# Patient Record
Sex: Female | Born: 1950 | Race: White | Hispanic: No | Marital: Married | Smoking: Never smoker
Health system: Southern US, Community
[De-identification: ages and names within clinical notes are randomized; demographics above are authoritative.]

## PROBLEM LIST (undated history)

## (undated) DIAGNOSIS — K219 Gastro-esophageal reflux disease without esophagitis: Secondary | ICD-10-CM

## (undated) DIAGNOSIS — M1990 Unspecified osteoarthritis, unspecified site: Secondary | ICD-10-CM

## (undated) DIAGNOSIS — G894 Chronic pain syndrome: Secondary | ICD-10-CM

## (undated) DIAGNOSIS — I10 Essential (primary) hypertension: Secondary | ICD-10-CM

## (undated) DIAGNOSIS — G43909 Migraine, unspecified, not intractable, without status migrainosus: Secondary | ICD-10-CM

## (undated) HISTORY — PX: OTHER SURGICAL HISTORY: SHX169

## (undated) HISTORY — PX: ANKLE CLOSED REDUCTION: SHX880

## (undated) HISTORY — PX: ABDOMINAL HYSTERECTOMY: SHX81

---

## 2006-10-22 ENCOUNTER — Emergency Department (HOSPITAL_COMMUNITY): Admission: EM | Admit: 2006-10-22 | Discharge: 2006-10-22 | Payer: Self-pay | Admitting: Emergency Medicine

## 2006-11-08 ENCOUNTER — Inpatient Hospital Stay (HOSPITAL_COMMUNITY): Admission: RE | Admit: 2006-11-08 | Discharge: 2006-11-09 | Payer: Self-pay | Admitting: Neurosurgery

## 2007-05-16 ENCOUNTER — Ambulatory Visit: Payer: Self-pay | Admitting: Gastroenterology

## 2010-02-01 ENCOUNTER — Encounter: Payer: Self-pay | Admitting: Neurosurgery

## 2010-05-26 NOTE — Op Note (Signed)
Yvonne Taylor, Yvonne Taylor               ACCOUNT NO.:  [id number]   MEDICAL RECORD NO.:  [id number]          PATIENT TYPE:  INP   LOCATION:  3029                         FACILITY:  [facility]   PHYSICIAN:  Clydene Fake, M.D.  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   PROCEDURE:  Intracervical decompression, diskectomy and fusion at C5-6  with LifeNet allograft bone Trestle anterior cervical plate.   SURGEON:  Clydene Fake, M.D.   General endotracheal tube anesthesia.   ASSISTANT:  None.   ESTIMATED BLOOD LOSS:  Minimal.   BLOOD GIVEN:  None.   DRAINS:  None.   COMPLICATIONS:  None.   REASON FOR PROCEDURE:  The patient is a 60 year old woman who has had  arm tingling bilaterally with neck pain.  MRI was done showing some  multiple spondylitic changes in the neck with large central disk  herniation at 5-6, compression of the spinal with some cord change.  The  patient with some myelopathic findings on exam including some  dysesthetic sensation in the hands.  The patient is brought in for  decompression of the spinal cord.   PROCEDURE IN DETAIL:  The patient was brought to the operating room,  general anesthesia was induced.  The patient was placed in 10 pounds  Holter traction, prepped and draped in a sterile fashion.  The site of  incision was injected with 10 mL of 1% lidocaine with epinephrine.  An  incision was then made from the midline to the anterior border of the  sternocleidomastoid muscle in the left side of the neck, incision take  down to the platysma and hemostasis obtained was obtained with Bovie  cauterization.  The platysma was opened with the Bovie and blunt  dissection taken  through the anterior cervical fascia of the anterior  cervical spine.  Two needles were placed in the disk spaces.  X-rays  obtained showing the bottom needle at 5-6 level, top needle at 4-5.  Needles were removed as the 5-6 disk was incised with a 15 blade and  partial diskectomy performed.  The longus colli muscles were reflected  laterally with the Bovie and then a self-retaining retractor was placed.  Diskectomy was continued with pituitary rongeurs and curets.  Anterior  osteophytes removed with Kerrison punches and distraction pins placed  into C5 and C6 and the interspace distracted.  Diskectomy was then  continued and a microscope was brought in for microdissection, 1 and 2  mm Kerrison punches were used to remove posterior disk ligament and  osteomatous free fragments of disk were found.  These were removed  decompressing the central canal.  We found more free fragments going  caudally going behind the body of 6 and these were removed with the  nerve hook and bilateral foraminotomies were performed.  When we were  finished, we had  good decompression of the central canal and nerve  roots.  A high speed drill was used to remove cartilaginous end plate.  We measured the height of the disk space to be 5 mm.  A 5-mm LifeNet  allograft bone was tapped into place, countersunk a couple millimeters.  We check posterior to the graft with the nerve hook and there was plenty  of room between the bone graft and dura.  The wound was irrigated with  antibiotics solution and distraction pins were removed.  Hemostasis was  obtained with Gelfoam and thrombin.  Weight was removed from the  traction.  We still had good position of the bone plug and a Trestle  anterior cervical plate was placed over the anterior cervical spine.  Two screws were placed in the C5, two in the C6.  These were tightened  down.  Landmarks were obtained and we could see the screws at C5 and  vaguely the screws going to C6.   The body habitus, it was hard to read  the bottom part of plate but through intraoperatively, very good.  We  irrigated with antibiotic solution.  We had good hemostasis and the  retractors were removed.  Fascia closed with 0 Vicryl interrupted  sutures.  Subcutaneous tissue closed with the same.  Skin closed with  benzoin and Steri-Strips.  Dressings were placed.  The patient was  placed back in the supine position, awoken from anesthesia and  transferred to the recovery room in a stable condition.           ______________________________  Clydene Fake, M.D.     JRH/MEDQ  D:  11/08/2006  T:  11/08/2006  Job:  [id number]

## 2010-07-08 ENCOUNTER — Other Ambulatory Visit (HOSPITAL_COMMUNITY): Payer: Self-pay | Admitting: Family Medicine

## 2010-07-08 DIAGNOSIS — R599 Enlarged lymph nodes, unspecified: Secondary | ICD-10-CM

## 2010-07-13 ENCOUNTER — Other Ambulatory Visit (HOSPITAL_COMMUNITY): Payer: Self-pay

## 2010-10-21 LAB — URINALYSIS, ROUTINE W REFLEX MICROSCOPIC
Bilirubin Urine: NEGATIVE
Glucose, UA: NEGATIVE
Ketones, ur: NEGATIVE
Leukocytes, UA: NEGATIVE
Nitrite: NEGATIVE
Urobilinogen, UA: 1

## 2010-10-21 LAB — CBC
Hemoglobin: 14
RBC: 4.51
RDW: 13.8
WBC: 15.2 — ABNORMAL HIGH

## 2010-10-21 LAB — URINE MICROSCOPIC-ADD ON

## 2010-10-21 LAB — BASIC METABOLIC PANEL
CO2: 30
Calcium: 10
Creatinine, Ser: 1.02
GFR calc Af Amer: >60
GFR calc non Af Amer: 56 — ABNORMAL LOW

## 2010-10-21 LAB — APTT: aPTT: 22 — ABNORMAL LOW

## 2010-10-21 LAB — PROTIME-INR: INR: 0.9

## 2011-08-17 ENCOUNTER — Ambulatory Visit: Payer: Self-pay | Admitting: Family Medicine

## 2011-12-01 ENCOUNTER — Ambulatory Visit: Payer: Self-pay | Admitting: Family Medicine

## 2014-11-05 ENCOUNTER — Emergency Department
Admission: EM | Admit: 2014-11-05 | Discharge: 2014-11-05 | Disposition: A | Discharge status: Home / Self Care | Payer: Medicare Other | Attending: Emergency Medicine | Admitting: Emergency Medicine

## 2014-11-05 ENCOUNTER — Encounter: Payer: Self-pay | Admitting: Emergency Medicine

## 2014-11-05 DIAGNOSIS — R0602 Shortness of breath: Secondary | ICD-10-CM | POA: Insufficient documentation

## 2014-11-05 DIAGNOSIS — I10 Essential (primary) hypertension: Secondary | ICD-10-CM | POA: Diagnosis not present

## 2014-11-05 DIAGNOSIS — R110 Nausea: Secondary | ICD-10-CM | POA: Insufficient documentation

## 2014-11-05 DIAGNOSIS — R42 Dizziness and giddiness: Secondary | ICD-10-CM | POA: Insufficient documentation

## 2014-11-05 HISTORY — DX: Gastro-esophageal reflux disease without esophagitis: K21.9

## 2014-11-05 HISTORY — DX: Chronic pain syndrome: G89.4

## 2014-11-05 HISTORY — DX: Essential (primary) hypertension: I10

## 2014-11-05 HISTORY — DX: Unspecified osteoarthritis, unspecified site: M19.90

## 2014-11-05 HISTORY — DX: Migraine, unspecified, not intractable, without status migrainosus: G43.909

## 2014-11-05 LAB — URINALYSIS COMPLETE WITH MICROSCOPIC (ARMC ONLY)
Bacteria, UA: NONE SEEN
Bilirubin Urine: NEGATIVE
Glucose, UA: NEGATIVE mg/dL
KETONES UR: NEGATIVE mg/dL
LEUKOCYTES UA: NEGATIVE
Nitrite: NEGATIVE
PH: 6 (ref 5.0–8.0)
PROTEIN: NEGATIVE mg/dL
SPECIFIC GRAVITY, URINE: 1.006 (ref 1.005–1.030)

## 2014-11-05 LAB — CBC
HCT: 39.1 % (ref 35.0–47.0)
HEMOGLOBIN: 13 g/dL (ref 12.0–16.0)
MCH: 30.3 pg (ref 26.0–34.0)
MCHC: 33.3 g/dL (ref 32.0–36.0)
MCV: 90.8 fL (ref 80.0–100.0)
PLATELETS: 376 10*3/uL (ref 150–440)
RBC: 4.31 MIL/uL (ref 3.80–5.20)
RDW: 13.5 % (ref 11.5–14.5)
WBC: 6.6 10*3/uL (ref 3.6–11.0)

## 2014-11-05 LAB — BASIC METABOLIC PANEL
ANION GAP: 9 (ref 5–15)
BUN: 15 mg/dL (ref 6–20)
CHLORIDE: 104 mmol/L (ref 101–111)
CO2: 25 mmol/L (ref 22–32)
CREATININE: 0.96 mg/dL (ref 0.44–1.00)
Calcium: 9 mg/dL (ref 8.9–10.3)
GFR calc non Af Amer: >60 mL/min (ref >60)
Glucose, Bld: 109 mg/dL — ABNORMAL HIGH (ref 65–99)
POTASSIUM: 4.4 mmol/L (ref 3.5–5.1)
SODIUM: 138 mmol/L (ref 135–145)

## 2014-11-05 MED ORDER — MECLIZINE HCL 25 MG PO TABS
50.0000 mg | ORAL_TABLET | Freq: Once | ORAL | Status: AC
Start: 2014-11-05 — End: 2014-11-05
  Administered 2014-11-05: 50 mg via ORAL
  Filled 2014-11-05: qty 2

## 2014-11-05 MED ORDER — MECLIZINE HCL 25 MG PO TABS
25.0000 mg | ORAL_TABLET | Freq: Three times a day (TID) | ORAL | Status: DC | PRN
Start: 2014-11-05 — End: 2017-04-06

## 2014-11-05 MED ORDER — DIAZEPAM 5 MG PO TABS
5.0000 mg | ORAL_TABLET | Freq: Once | ORAL | Status: AC
  Administered 2014-11-05: 5 mg via ORAL
  Filled 2014-11-05: qty 1

## 2014-11-05 MED ORDER — DIAZEPAM 5 MG PO TABS
5.0000 mg | ORAL_TABLET | Freq: Three times a day (TID) | ORAL | Status: DC | PRN
Start: 2014-11-05 — End: 2017-04-06

## 2014-11-05 NOTE — ED Provider Notes (Addendum)
[facility] Emergency Department Provider Note     Time seen: ----------------------------------------- 10:35 AM on 11/05/2014 -----------------------------------------    I have reviewed the triage vital signs and the nursing notes.   HISTORY  Chief Complaint Dizziness    HPI Yvonne Taylor is a 64 y.o. female who presents ER for earache 2 weeks ago. Patient states by week and half ago she had increased dizziness worse when sheturns her head. Patient feels like the room is spinning and feels nauseated. She's had this happen before but never this severe. She denies fevers, chills, chest pain. She does have shortness of breath and she is wondering if maybe this is anxiety.   Past Medical History  Diagnosis Date  . Hypertension   . GERD (gastroesophageal reflux disease)   . Migraines   . Arthritis   . Chronic pain syndrome     There are no active problems to display for this patient.   Past Surgical History  Procedure Laterality Date  . Abdominal hysterectomy      Allergies Amitriptyline; Augmentin; Celebrex; Ciprofloxacin; Clonidine derivatives; Cymbalta; Elavil; Hydralazine; Hydrocortisone; Lipitor; Lyrica; Micardis; Mobic; Neurontin; Norvasc; Paxil; Prozac; and Vasotec  Social History Social History  Substance Use Topics  . Smoking status: Never Smoker   . Smokeless tobacco: None  . Alcohol Use: No    Review of Systems Constitutional: Negative for fever. Eyes: Negative for visual changes. ENT: Negative for sore throat. Cardiovascular: Negative for chest pain. Respiratory: Positive for shortness of breath Gastrointestinal: Negative for abdominal pain, vomiting and diarrhea. Genitourinary: Negative for dysuria. Musculoskeletal: Negative for back pain. Skin: Negative for rash. Neurological: Negative for headaches, positive for dizziness  10-point ROS otherwise negative.  ____________________________________________   PHYSICAL  EXAM:  VITAL SIGNS: ED Triage Vitals  Enc Vitals Group     BP 11/05/14 0936 194/90 mmHg     Pulse Rate 11/05/14 0936 70     Resp 11/05/14 0936 20     Temp 11/05/14 0936 97.5 F (36.4 C)     Temp Source 11/05/14 0936 Oral     SpO2 11/05/14 0936 99 %     Weight 11/05/14 0936 260 lb (117.935 kg)     Height 11/05/14 0936 5\' 8"  (1.727 m)     Head Cir --      Peak Flow --      Pain Score 11/05/14 0937 0     Pain Loc --      Pain Edu? --      Excl. in GC? --     Constitutional: Alert and oriented. Well appearing and in no distress. Eyes: Conjunctivae are normal. PERRL. Normal extraocular movements. ENT   Head: Normocephalic and atraumatic.   Nose: No congestion/rhinnorhea.   Mouth/Throat: Mucous membranes are moist.   Neck: No stridor. Cardiovascular: Normal rate, regular rhythm. Normal and symmetric distal pulses are present in all extremities. No murmurs, rubs, or gallops. Respiratory: Normal respiratory effort without tachypnea nor retractions. Breath sounds are clear and equal bilaterally. No wheezes/rales/rhonchi. Gastrointestinal: Soft and nontender. No distention. No abdominal bruits.  Musculoskeletal: Nontender with normal range of motion in all extremities. No joint effusions.  No lower extremity tenderness nor edema. Neurologic:  Normal speech and language. No gross focal neurologic deficits are appreciated. Speech is normal. No gait instability. Skin:  Skin is warm, dry and intact. No rash noted. Psychiatric: Mood and affect are normal. Speech and behavior are normal. Patient exhibits appropriate insight and judgment. ____________________________________________  EKG: Interpreted by  me. Normal sinus rhythm normal axis normal intervals. No evidence of hypertrophy or acute infarction. Rate is 60 bpm  ____________________________________________  ED COURSE:  Pertinent labs & imaging results that were available during my care of the patient were reviewed by me  and considered in my medical decision making (see chart for details). Patient describes peripheral vertigo. She received meclizine and Valium and we will check basic labs. ____________________________________________    LABS (pertinent positives/negatives)  Labs Reviewed  BASIC METABOLIC PANEL - Abnormal; Notable for the following:    Glucose, Bld 109 (*)    All other components within normal limits  URINALYSIS COMPLETEWITH MICROSCOPIC ([facility] ONLY) - Abnormal; Notable for the following:    Color, Urine STRAW (*)    APPearance CLEAR (*)    Hgb urine dipstick 2+ (*)    Squamous Epithelial / LPF 0-5 (*)    All other components within normal limits  CBC   ____________________________________________  FINAL ASSESSMENT AND PLAN  Vertigo  Plan: Patient with labs as dictated above. Patient is in no acute distress, feeling better after meclizine and Valium. She'll be discharged with the same medications and referred to ENT for follow-up   Emily Filbert, MD   Emily Filbert, MD 11/05/14 1037  Emily Filbert, MD 11/05/14 1155

## 2014-11-05 NOTE — ED Notes (Signed)
Pt to ed with c/o earache x 2 weeks ago.  Pt states about 1 1/2 weeks ago increased dizziness, worse with laying down.  Pt also reports sob associated with the dizziness.  Pt states when she lays flat and turns over in the bed, the room feels like it is spinning and she feels nauseated.

## 2014-11-05 NOTE — Discharge Instructions (Signed)

## 2016-08-16 DIAGNOSIS — M19019 Primary osteoarthritis, unspecified shoulder: Secondary | ICD-10-CM | POA: Insufficient documentation

## 2017-03-15 ENCOUNTER — Ambulatory Visit: Payer: Self-pay | Admitting: Urology

## 2017-03-17 ENCOUNTER — Encounter: Payer: Self-pay | Admitting: Urology

## 2017-03-17 ENCOUNTER — Ambulatory Visit: Payer: Medicare Other | Admitting: Urology

## 2017-03-17 VITALS — BP 176/98 | HR 76 | Ht 67.0 in | Wt 263.0 lb

## 2017-03-17 DIAGNOSIS — N3941 Urge incontinence: Secondary | ICD-10-CM

## 2017-03-17 DIAGNOSIS — N3281 Overactive bladder: Secondary | ICD-10-CM

## 2017-03-17 LAB — BLADDER SCAN AMB NON-IMAGING: SCAN RESULT: 41

## 2017-03-17 NOTE — Progress Notes (Signed)
03/17/2017 4:37 PM   Oneal Deputy 07/09/1950 [id number]  Referring provider: Leanna Sato, MD [address] [place], [place] [address]  Chief Complaint  Patient presents with  . New Patient (Initial Visit)    HPI: Patient is a 67 -year-old Caucasian female who is referred to [place] by Dr. Darreld Mclean for urinary incontinence with her daughter, Thayer Ohm.    Patient states that she has had urinary incontinence since 2016.  She started to wear panty liners last year.  She is now up to regular pads.    Patient has incontinence with urge, but she denies any leakage with laughing, coughing, etc.  She states her worst time is in the morning.  She wakes to void at 4 am, 6 am and 7 am.  She is also having nocturia x 2-3.    She is having associated urinary frequency, strong urgency and intermittency.  Patient denies any gross hematuria, dysuria or suprapubic/flank pain.  Patient denies any fevers, chills, nausea or vomiting.  Her PVR is 41 mL.    She does not have a history of urinary tract infections, STI's or injury to the bladder.   She does not have a history of nephrolithiasis, GU surgery or GU trauma.     She is not sexually active.  She is post menopausal.   She denies constipation and/or diarrhea.   She is not having pain with bladder filling.    She has not had any recent imaging studies.    She is not drinking water daily.   She states she tried to drink 20 to 30 ounces of water, but it just "went right through her."  She drinks 4 to 6 Dr. Osvaldo Angst daily.  She does not drink coffee or tea.  She has a glass of orange juice in the morning.  She does not drink alcohol.   She had been on Vesicare 5 mg daily, but she quit the medication 3 months ago and noticed no difference.  She tried AZO OTC and it did not help.       PMH: Past Medical History:  Diagnosis Date  . Arthritis   . Chronic pain syndrome   . GERD (gastroesophageal reflux disease)   . Hypertension   .  Migraines     Surgical History: Past Surgical History:  Procedure Laterality Date  . ABDOMINAL HYSTERECTOMY    . ANKLE CLOSED REDUCTION    . carpel tunnel    . neck fusion    . spinal allograft      Home Medications:  Allergies as of 03/17/2017      Reactions   Amitriptyline Swelling   Augmentin [amoxicillin-pot Clavulanate] Swelling   Celebrex [celecoxib] Swelling   Ciprofloxacin Swelling   Clonidine Derivatives Swelling   Cymbalta [duloxetine Hcl] Swelling   Elavil [amitriptyline Hcl] Swelling   Hydralazine Swelling   Hydrocortisone Swelling   Lipitor [atorvastatin] Swelling   Lyrica [pregabalin] Swelling   Micardis [telmisartan] Swelling   Mobic [meloxicam] Swelling   Neurontin [gabapentin] Swelling   Norvasc [amlodipine Besylate] Swelling   Paxil [paroxetine] Swelling   Prozac [fluoxetine Hcl] Swelling   Vasotec [enalapril Maleate] Swelling      Medication List        Accurate as of 03/17/17  4:37 PM. Always use your most recent med list.          diazepam 5 MG tablet Commonly known as:  VALIUM Take 1 tablet (5 mg total) by mouth every 8 (  eight) hours as needed (to be taken with meclizine).   etodolac 500 MG tablet Commonly known as:  LODINE Take 500 mg by mouth 2 (two) times daily.   hydrOXYzine 25 MG tablet Commonly known as:  ATARAX/VISTARIL Take 25 mg by mouth 3 (three) times daily as needed.   meclizine 25 MG tablet Commonly known as:  ANTIVERT Take 1 tablet (25 mg total) by mouth 3 (three) times daily as needed for dizziness or nausea.   omeprazole 20 MG capsule Commonly known as:  PRILOSEC Take 20 mg by mouth daily.   solifenacin 5 MG tablet Commonly known as:  VESICARE Take 5 mg by mouth daily.       Allergies:  Allergies  Allergen Reactions  . Amitriptyline Swelling  . Augmentin [Amoxicillin-Pot Clavulanate] Swelling  . Celebrex [Celecoxib] Swelling  . Ciprofloxacin Swelling  . Clonidine Derivatives Swelling  . Cymbalta  [Duloxetine Hcl] Swelling  . Elavil [Amitriptyline Hcl] Swelling  . Hydralazine Swelling  . Hydrocortisone Swelling  . Lipitor [Atorvastatin] Swelling  . Lyrica [Pregabalin] Swelling  . Micardis [Telmisartan] Swelling  . Mobic [Meloxicam] Swelling  . Neurontin [Gabapentin] Swelling  . Norvasc [Amlodipine Besylate] Swelling  . Paxil [Paroxetine] Swelling  . Prozac [Fluoxetine Hcl] Swelling  . Vasotec [Enalapril Maleate] Swelling    Family History: Family History  Problem Relation Age of Onset  . Hematuria Mother   . Kidney cancer Neg Hx   . Kidney disease Neg Hx   . Prostate cancer Neg Hx     Social History:  reports that  has never smoked. she has never used smokeless tobacco. She reports that she does not drink alcohol or use drugs.  ROS: UROLOGY Frequent Urination?: Yes Hard to postpone urination?: Yes Burning/pain with urination?: No Get up at night to urinate?: Yes Leakage of urine?: Yes Urine stream starts and stops?: Yes Trouble starting stream?: No Do you have to strain to urinate?: No Blood in urine?: No Urinary tract infection?: No Sexually transmitted disease?: No Injury to kidneys or bladder?: No Painful intercourse?: No Weak stream?: No Currently pregnant?: No Vaginal bleeding?: No Last menstrual period?: n  Gastrointestinal Nausea?: No Vomiting?: No Indigestion/heartburn?: No Diarrhea?: No Constipation?: No  Constitutional Fever: No Night sweats?: No Weight loss?: No Fatigue?: No  Skin Skin rash/lesions?: No Itching?: No  Eyes Blurred vision?: No Double vision?: No  Ears/Nose/Throat Sore throat?: No Sinus problems?: Yes  Hematologic/Lymphatic Swollen glands?: No Easy bruising?: Yes  Cardiovascular Leg swelling?: Yes Chest pain?: No  Respiratory Cough?: No Shortness of breath?: Yes  Endocrine Excessive thirst?: No  Musculoskeletal Back pain?: Yes Joint pain?: Yes  Neurological Headaches?: No Dizziness?:  No  Psychologic Depression?: Yes Anxiety?: No  Physical Exam: BP (!) 176/98   Pulse 76   Ht 5\' 7"  (1.702 m)   Wt 263 lb (119.3 kg)   BMI 41.19 kg/m   Constitutional: Well nourished. Alert and oriented, No acute distress. HEENT:  AT, moist mucus membranes. Trachea midline, no masses. Cardiovascular: No clubbing, cyanosis, or edema. Respiratory: Normal respiratory effort, no increased work of breathing. GI: Abdomen is soft, non tender, non distended, no abdominal masses. Liver and spleen not palpable.  No hernias appreciated.  Stool sample for occult testing is not indicated.   GU: No CVA tenderness.  No bladder fullness or masses.  Atrophic external genitalia, normal pubic hair distribution, no lesions.  Normal urethral meatus, no lesions, no prolapse, no discharge.   No urethral masses, tenderness and/or tenderness. No bladder fullness, tenderness  or masses. Pale vagina mucosa, poor estrogen effect, no discharge, no lesions, good pelvic support, grade II cystocele noted.  Rectocele noted.  Cervix, uterus and adnexa are surgically absent.  Anus and perineum are without rashes or lesions.    Skin: No rashes, bruises or suspicious lesions. Lymph: No cervical or inguinal adenopathy. Neurologic: Grossly intact, no focal deficits, moving all 4 extremities. Psychiatric: Normal mood and affect.  Laboratory Data: Lab Results  Component Value Date   WBC 6.6 11/05/2014   HGB 13.0 11/05/2014   HCT 39.1 11/05/2014   MCV 90.8 11/05/2014   PLT 376 11/05/2014    Lab Results  Component Value Date   CREATININE 0.96 11/05/2014    No results found for: PSA  No results found for: TESTOSTERONE  No results found for: HGBA1C  No results found for: TSH  No results found for: CHOL, HDL, CHOLHDL, VLDL, LDLCALC  No results found for: AST No results found for: ALT No components found for: ALKALINEPHOPHATASE No components found for: BILIRUBINTOTAL  No results found for:  ESTRADIOL  Urinalysis    Component Value Date/Time   COLORURINE STRAW (A) 11/05/2014 0953   APPEARANCEUR CLEAR (A) 11/05/2014 0953   LABSPEC 1.006 11/05/2014 0953   PHURINE 6.0 11/05/2014 0953   GLUCOSEU NEGATIVE 11/05/2014 0953   HGBUR 2+ (A) 11/05/2014 0953   BILIRUBINUR NEGATIVE 11/05/2014 0953   KETONESUR NEGATIVE 11/05/2014 0953   PROTEINUR NEGATIVE 11/05/2014 0953   UROBILINOGEN 1.0 11/04/2006 1119   NITRITE NEGATIVE 11/05/2014 0953   LEUKOCYTESUR NEGATIVE 11/05/2014 0953   I have reviewed the labs.   Pertinent Imaging: Results for Oneal DeputyBRIGGS, Almendra F (MRN [id number]) as of 04/04/2017 13:08  Ref. Range 03/17/2017 16:13  Scan Result Unknown 41    Assessment & Plan:    1. Urge Incontinence  - offered behavioral therapies, bladder training, bladder control strategies and pelvic floor muscle training  - fluid management - encouraged patient to avoid sodas and other sugary drinks  - patient with uncontrolled HTN so she is not a candidate for beta-3 adrenergic receptor agonist   - would like to try anticholinergic therapy.  Given Toviaz 8mg  samples, # 28.   Advised of the side effects, such as: Dry eyes, dry mouth, constipation, mental confusion and/or urinary retention.   - RTC in 3 weeks for PVR and symptom recheck   - BLADDER SCAN AMB NON-IMAGING   Return in about 3 weeks (around 04/07/2017) for PVR and OAB questionnaire.  These notes generated with voice recognition software. I apologize for typographical errors.  Michiel CowboySHANNON Mckenzye Cutright, PA-C  [facility] [address], [address] [place], [place] [address] [phone]  I spent 45 minutes with this patient of which greater than 50% was spent in counseling & coordination of care with the patient.

## 2017-04-04 ENCOUNTER — Ambulatory Visit: Payer: Medicare Other | Admitting: Urology

## 2017-04-05 NOTE — Progress Notes (Signed)
04/06/2017 2:39 PM   Yvonne Taylor 09-24-1950 [id number]  Referring provider: Leanna Sato, MD [address] [place], [place] [address]  No chief complaint on file.   HPI: 67 yo WF with urge incontinence who presents today for a three week follow up after a trial of Toviaz.  Background history Patient is a 77 -year-old Caucasian female who is referred to [place] by Dr. Darreld Mclean for urinary incontinence with her daughter, Thayer Ohm.  Patient states that she has had urinary incontinence since 2016.  She started to wear panty liners last year.  She is now up to regular pads.  Patient has incontinence with urge, but she denies any leakage with laughing, coughing, etc.  She states her worst time is in the morning.  She wakes to void at 4 am, 6 am and 7 am.  She is also having nocturia x 2-3.  She is having associated urinary frequency, strong urgency and intermittency.  Patient denies any gross hematuria, dysuria or suprapubic/flank pain.  Patient denies any fevers, chills, nausea or vomiting.  Her PVR is 41 mL.  She does not have a history of urinary tract infections, STI's or injury to the bladder.  She does not have a history of nephrolithiasis, GU surgery or GU trauma.   She is not sexually active.  She is post menopausal.   She denies constipation and/or diarrhea.   She is not having pain with bladder filling.   She has not had any recent imaging studies.  She is not drinking water daily.   She states she tried to drink 20 to 30 ounces of water, but it just "went right through her."  She drinks 4 to 6 Dr. Osvaldo Angst daily.  She does not drink coffee or tea.  She has a glass of orange juice in the morning.  She does not drink alcohol.   She had been on Vesicare 5 mg daily, but she quit the medication 3 months ago and noticed no difference.  She tried AZO OTC and it did not help.      She was started on Toviaz 8 mg daily.  The patient has been experiencing urgency x 4-7, frequency x 4-7,  is restricting fluids to avoid visits to the restroom, is engaging in toilet mapping, incontinence x 0-3 and nocturia x 4-7.  Her PVR is 45 mL.  Patient denies any gross hematuria, dysuria or suprapubic/flank pain.  Patient denies any fevers, chills, nausea or vomiting.   She is down to a panty liner daily.  She is having a dry mouth.     PMH: Past Medical History:  Diagnosis Date  . Arthritis   . Chronic pain syndrome   . GERD (gastroesophageal reflux disease)   . Hypertension   . Migraines     Surgical History: Past Surgical History:  Procedure Laterality Date  . ABDOMINAL HYSTERECTOMY    . ANKLE CLOSED REDUCTION    . carpel tunnel    . neck fusion    . spinal allograft      Home Medications:  Allergies as of 04/06/2017      Reactions   Amitriptyline Swelling   Augmentin [amoxicillin-pot Clavulanate] Swelling   Celebrex [celecoxib] Swelling   Ciprofloxacin Swelling   Clonidine Derivatives Swelling   Cymbalta [duloxetine Hcl] Swelling   Elavil [amitriptyline Hcl] Swelling   Hydralazine Swelling   Hydrocortisone Swelling   Lipitor [atorvastatin] Swelling   Lyrica [pregabalin] Swelling   Micardis [telmisartan] Swelling  Mobic [meloxicam] Swelling   Neurontin [gabapentin] Swelling   Norvasc [amlodipine Besylate] Swelling   Paxil [paroxetine] Swelling   Prozac [fluoxetine Hcl] Swelling   Vasotec [enalapril Maleate] Swelling      Medication List        Accurate as of 04/06/17  2:39 PM. Always use your most recent med list.          etodolac 500 MG tablet Commonly known as:  LODINE Take 500 mg by mouth 2 (two) times daily.   fesoterodine 8 MG Tb24 tablet Commonly known as:  TOVIAZ Take 1 tablet (8 mg total) by mouth daily.   hydrOXYzine 25 MG tablet Commonly known as:  ATARAX/VISTARIL Take 25 mg by mouth 3 (three) times daily as needed.   omeprazole 20 MG capsule Commonly known as:  PRILOSEC Take 20 mg by mouth daily.   solifenacin 5 MG tablet Commonly  known as:  VESICARE Take 5 mg by mouth daily.   valsartan 320 MG tablet Commonly known as:  DIOVAN Take 320 mg by mouth daily.       Allergies:  Allergies  Allergen Reactions  . Amitriptyline Swelling  . Augmentin [Amoxicillin-Pot Clavulanate] Swelling  . Celebrex [Celecoxib] Swelling  . Ciprofloxacin Swelling  . Clonidine Derivatives Swelling  . Cymbalta [Duloxetine Hcl] Swelling  . Elavil [Amitriptyline Hcl] Swelling  . Hydralazine Swelling  . Hydrocortisone Swelling  . Lipitor [Atorvastatin] Swelling  . Lyrica [Pregabalin] Swelling  . Micardis [Telmisartan] Swelling  . Mobic [Meloxicam] Swelling  . Neurontin [Gabapentin] Swelling  . Norvasc [Amlodipine Besylate] Swelling  . Paxil [Paroxetine] Swelling  . Prozac [Fluoxetine Hcl] Swelling  . Vasotec [Enalapril Maleate] Swelling    Family History: Family History  Problem Relation Age of Onset  . Hematuria Mother   . Kidney cancer Neg Hx   . Kidney disease Neg Hx   . Prostate cancer Neg Hx     Social History:  reports that she has never smoked. She has never used smokeless tobacco. She reports that she does not drink alcohol or use drugs.  ROS: UROLOGY Frequent Urination?: No Hard to postpone urination?: No Burning/pain with urination?: No Get up at night to urinate?: Yes Leakage of urine?: No Urine stream starts and stops?: No Trouble starting stream?: No Do you have to strain to urinate?: No Blood in urine?: No Urinary tract infection?: No Sexually transmitted disease?: No Injury to kidneys or bladder?: No Painful intercourse?: No Weak stream?: No Currently pregnant?: No Vaginal bleeding?: No Last menstrual period?: n  Gastrointestinal Nausea?: No Vomiting?: No Indigestion/heartburn?: No Diarrhea?: No Constipation?: No  Constitutional Fever: No Night sweats?: No Weight loss?: No Fatigue?: No  Skin Skin rash/lesions?: No Itching?: No  Eyes Blurred vision?: No Double vision?:  No  Ears/Nose/Throat Sore throat?: No Sinus problems?: Yes  Hematologic/Lymphatic Swollen glands?: No Easy bruising?: No  Cardiovascular Leg swelling?: No Chest pain?: No  Respiratory Cough?: Yes Shortness of breath?: Yes  Endocrine Excessive thirst?: No  Musculoskeletal Back pain?: Yes Joint pain?: No  Neurological Headaches?: No Dizziness?: No  Psychologic Depression?: No Anxiety?: No  Physical Exam: BP (!) 181/95   Pulse 73   Ht 5\' 7"  (1.702 m)   Wt 269 lb 6.4 oz (122.2 kg)   BMI 42.19 kg/m   Constitutional: Well nourished. Alert and oriented, No acute distress. HEENT: [place] AT, moist mucus membranes. Trachea midline, no masses. Cardiovascular: No clubbing, cyanosis, or edema. Respiratory: Normal respiratory effort, no increased work of breathing. Skin: No rashes, bruises or  suspicious lesions. Lymph: No cervical or inguinal adenopathy. Neurologic: Grossly intact, no focal deficits, moving all 4 extremities. Psychiatric: Normal mood and affect.   Laboratory Data: Lab Results  Component Value Date   WBC 6.6 11/05/2014   HGB 13.0 11/05/2014   HCT 39.1 11/05/2014   MCV 90.8 11/05/2014   PLT 376 11/05/2014    Lab Results  Component Value Date   CREATININE 0.96 11/05/2014    No results found for: PSA  No results found for: TESTOSTERONE  No results found for: HGBA1C  No results found for: TSH  No results found for: CHOL, HDL, CHOLHDL, VLDL, LDLCALC  No results found for: AST No results found for: ALT No components found for: ALKALINEPHOPHATASE No components found for: BILIRUBINTOTAL  No results found for: ESTRADIOL  Urinalysis    Component Value Date/Time   COLORURINE STRAW (A) 11/05/2014 0953   APPEARANCEUR CLEAR (A) 11/05/2014 0953   LABSPEC 1.006 11/05/2014 0953   PHURINE 6.0 11/05/2014 0953   GLUCOSEU NEGATIVE 11/05/2014 0953   HGBUR 2+ (A) 11/05/2014 0953   BILIRUBINUR NEGATIVE 11/05/2014 0953   KETONESUR NEGATIVE 11/05/2014  0953   PROTEINUR NEGATIVE 11/05/2014 0953   UROBILINOGEN 1.0 11/04/2006 1119   NITRITE NEGATIVE 11/05/2014 0953   LEUKOCYTESUR NEGATIVE 11/05/2014 0953   I have reviewed the labs.   Pertinent Imaging: Results for Yvonne DeputyBRIGGS, Yvonne Taylor (MRN [id number]) as of 04/06/2017 14:33  Ref. Range 04/06/2017 14:31  Scan Result Unknown 45     Assessment & Plan:    1. Urge Incontinence  - offered behavioral therapies, bladder training, bladder control strategies and pelvic floor muscle training -patient currently is having difficulty with severe leg cramps and deferred at this time  - fluid management - encouraged patient to avoid sodas and other sugary drinks  - patient with uncontrolled HTN so she is not a candidate for beta-3 adrenergic receptor agonist   - Toviaz 8mg  was effective and she would like to continue the medication - script sent to pharmacy  - RTC in one year for PVR and symptom recheck   - BLADDER SCAN AMB NON-IMAGING   Return in about 1 year (around 04/07/2018) for PVR and OAB questionnaire.  These notes generated with voice recognition software. I apologize for typographical errors.  Michiel CowboySHANNON Jovaun Levene, PA-C  [facility] [address], [address] [place], [place] [address] [phone]  I spent 45 minutes with this patient of which greater than 50% was spent in counseling & coordination of care with the patient.

## 2017-04-06 ENCOUNTER — Telehealth: Payer: Self-pay | Admitting: Urology

## 2017-04-06 ENCOUNTER — Ambulatory Visit: Payer: Medicare Other | Admitting: Urology

## 2017-04-06 ENCOUNTER — Encounter: Payer: Self-pay | Admitting: Urology

## 2017-04-06 VITALS — BP 181/95 | HR 73 | Ht 67.0 in | Wt 269.4 lb

## 2017-04-06 DIAGNOSIS — N3941 Urge incontinence: Secondary | ICD-10-CM | POA: Diagnosis not present

## 2017-04-06 LAB — BLADDER SCAN AMB NON-IMAGING: SCAN RESULT: 45

## 2017-04-06 MED ORDER — FESOTERODINE FUMARATE ER 8 MG PO TB24: 8.0000 mg | ORAL_TABLET | Freq: Every day | ORAL | 11 refills | Status: DC

## 2017-04-06 NOTE — Telephone Encounter (Signed)
Patient's insurance will most likely deny her Toviaz at this time, but we need to appeal their decision as she has been on there formulary medication and they were not effective.

## 2017-05-11 ENCOUNTER — Telehealth: Payer: Self-pay | Admitting: Urology

## 2017-05-11 NOTE — Telephone Encounter (Signed)
Per documentation by Michiel Cowboy, PA on 04/06/17, this medication needs to be appealed with her insurance. Pt has already tried alternatives, she should remain on Toviaz. I have instructed pt that I will work on this PA, in the meantime she can come and pick up samples, pt states understanding and will come by Friday. Samples left up front.

## 2017-05-11 NOTE — Telephone Encounter (Signed)
Pt called office LMOM asking for another alternative to her "bladder pill" pt states that her insurance will no longer pay for what she's been taking. Pt did not specify the name of her "bladder pill" nor pharmacy. Please advise @ [phone]. Thanks.

## 2017-05-17 NOTE — Telephone Encounter (Addendum)
Submitted prior authorization for Toviaz , via covermymeds.

## 2017-05-18 MED ORDER — OXYBUTYNIN CHLORIDE ER 10 MG PO TB24: 10.0000 mg | ORAL_TABLET | Freq: Every day | ORAL | 3 refills | Status: DC

## 2017-05-18 NOTE — Addendum Note (Signed)
Addended by: Vickki Hearing on: 05/18/2017 10:33 AM   Modules accepted: Orders

## 2017-05-18 NOTE — Telephone Encounter (Signed)
Pt currently has samples of Toviaz, I will try and appeal based off the pt being a poor candidate for Myrbetriq due to her BP. Worst they can say is no, at which point we can proceed with Oxybutynin. Would you be okay with this?

## 2017-05-18 NOTE — Telephone Encounter (Signed)
She is not a good candidate for Myrbetriq as her BP is uncontrolled.  She has only been on Vesicare in the past.  We will have to have her try oxybutynin ER 10 mg daily.

## 2017-05-18 NOTE — Telephone Encounter (Signed)
Insurance rep states that it is recommended to proceed with Oxybutynin, he states pt needs to try this alternative even though Myrbetriq is contraindicated due to BP. Will send in oxybutynin and call pt.

## 2017-05-18 NOTE — Telephone Encounter (Signed)
Per pt insurance, she must try and fail Myrbetriq or Oxbutynin ER.   I do not see these listed in her chart as being tried, are we aware of hx with these?

## 2017-06-20 ENCOUNTER — Telehealth: Payer: Self-pay | Admitting: Urology

## 2017-06-20 NOTE — Telephone Encounter (Signed)
Spoke with patient and notified her she should contact her insurance company and find out what medications are on her formulary list. With that information we can then send in another medication for her to try. Patient verbalized understanding

## 2017-06-20 NOTE — Telephone Encounter (Signed)
Pt called to let us now she cannot take Oxybutnin.  Her mouth, tongue and throat are so dry, and she's losing her tastebuds.  She would like to know what her options are.

## 2017-11-04 DIAGNOSIS — M7062 Trochanteric bursitis, left hip: Secondary | ICD-10-CM | POA: Insufficient documentation

## 2018-01-31 DIAGNOSIS — I10 Essential (primary) hypertension: Secondary | ICD-10-CM | POA: Insufficient documentation

## 2018-01-31 DIAGNOSIS — M7989 Other specified soft tissue disorders: Secondary | ICD-10-CM | POA: Insufficient documentation

## 2018-01-31 DIAGNOSIS — I890 Lymphedema, not elsewhere classified: Secondary | ICD-10-CM | POA: Insufficient documentation

## 2018-04-03 ENCOUNTER — Ambulatory Visit: Payer: Medicare Other | Admitting: Urology

## 2018-07-06 ENCOUNTER — Encounter: Payer: Self-pay | Admitting: Neurology

## 2018-07-06 NOTE — Progress Notes (Signed)
New Patient Virtual Visit via Video Note The purpose of this virtual visit is to provide medical care while limiting exposure to the novel coronavirus.    Consent was obtained for video visit:  Yes.   Answered questions that patient had about telehealth interaction:  Yes.   I discussed the limitations, risks, security and privacy concerns of performing an evaluation and management service by telemedicine. I also discussed with the patient that there may be a patient responsible charge related to this service. The patient expressed understanding and agreed to proceed.  Pt location: Home Physician Location: office Name of referring provider:  Leanna Taylor, Yvonne M, MD I connected with Yvonne DeputyAmanda F Taylor at patients initiation/request on 07/07/2018 at  1:50 PM EDT by video enabled telemedicine application and verified that I am speaking with the correct person using two identifiers. Pt MRN:  [id number] Pt DOB:  1950-04-07 Video Participants:  Yvonne Taylor    History of Present Illness: Yvonne Deputymanda F Riviere is a 68 y.o. right-handed female with GERD, hypertension, and chronic pain syndrome presenting for evaluation of burning sensation of the lower legs.   Around early spring 2020, she began having burning pain over the anterior shins in both legs.  Pain does not affect the posterior leg or into the feet.  Pain is there with rest or activity and often wakes her up from sleeping.  There is no associated weakness.  She does complain of leg cramps which has improved after taking Soma.  She is currently taking Vicodin for severe pain and has mild relief.  She has previously tried Lyrica, Neurontin, Elavil, and Cymbalta for pain.    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  Upon further questioning, patient does endorse having vibration sensation of the feet the past few years, however her current symptoms of burning pain in the lower leg is new.  She has been  disability in 2011 for pain.    Past Medical History:  Diagnosis Date  . Arthritis   . Chronic pain syndrome   . GERD (gastroesophageal reflux disease)   . Hypertension   . Migraines     Past Surgical History:  Procedure Laterality Date  . ABDOMINAL HYSTERECTOMY    . ANKLE CLOSED REDUCTION    . carpel tunnel    . neck fusion    . spinal allograft       Medications:  Outpatient Encounter Medications as of 07/07/2018  Medication Sig  . carisoprodol (SOMA) 350 MG tablet Take 350 mg by mouth 3 (three) times daily.  Marland Kitchen. HYDROcodone-acetaminophen (NORCO/VICODIN) 5-325 MG tablet Take 1 tablet by mouth every 6 (six) hours as needed for moderate pain.  . hydrOXYzine (ATARAX/VISTARIL) 25 MG tablet Take 25 mg by mouth 3 (three) times daily as needed.  Marland Kitchen. omeprazole (PRILOSEC) 20 MG capsule Take 20 mg by mouth daily.  . solifenacin (VESICARE) 5 MG tablet Take 5 mg by mouth daily.  . valsartan (DIOVAN) 320 MG tablet Take 320 mg by mouth daily.  . vitamin B-12 (CYANOCOBALAMIN) 500 MCG tablet Take 500 mcg by mouth daily.  . [DISCONTINUED] oxybutynin (DITROPAN-XL) 10 MG 24 hr tablet Take 1 tablet (10 mg total) by mouth daily.  . [DISCONTINUED] etodolac (LODINE) 500 MG tablet Take 500 mg by mouth 2 (two) times daily.  . [DISCONTINUED] fesoterodine (TOVIAZ) 8 MG TB24 tablet Take 1 tablet (8 mg total) by mouth daily.   No facility-administered encounter medications on file as of 07/07/2018.  Allergies:  Allergies  Allergen Reactions  . Amitriptyline Swelling  . Augmentin [Amoxicillin-Pot Clavulanate] Swelling  . Celebrex [Celecoxib] Swelling  . Ciprofloxacin Swelling  . Clonidine Derivatives Swelling  . Cymbalta [Duloxetine Hcl] Swelling  . Elavil [Amitriptyline Hcl] Swelling  . Hydralazine Swelling  . Hydrocortisone Swelling  . Lipitor [Atorvastatin] Swelling  . Lyrica [Pregabalin] Swelling  . Micardis [Telmisartan] Swelling  . Mobic [Meloxicam] Swelling  . Neurontin [Gabapentin]  Swelling  . Norvasc [Amlodipine Besylate] Swelling  . Paxil [Paroxetine] Swelling  . Prozac [Fluoxetine Hcl] Swelling  . Vasotec [Enalapril Maleate] Swelling    Family History: Family History  Problem Relation Age of Onset  . Hematuria Mother   . Kidney cancer Neg Hx   . Kidney disease Neg Hx   . Prostate cancer Neg Hx     Social History: Social History   Tobacco Use  . Smoking status: Never Smoker  . Smokeless tobacco: Never Used  Substance Use Topics  . Alcohol use: No  . Drug use: No   Social History   Social History Narrative   Lives alone      No steps in home      Right handed      Highest level of edu- 10th grade       Review of Systems:  CONSTITUTIONAL: No fevers, chills, night sweats, or weight loss.   EYES: No visual changes or eye pain ENT: No hearing changes.  No history of nose bleeds.   RESPIRATORY: No cough, wheezing and shortness of breath.   CARDIOVASCULAR: Negative for chest pain, and palpitations.   GI: Negative for abdominal discomfort, blood in stools or black stools.  No recent change in bowel habits.   GU:  No history of incontinence.   MUSCLOSKELETAL: +history of joint pain or swelling.  +myalgias.   SKIN: Negative for lesions, rash, and itching.   HEMATOLOGY/ONCOLOGY: Negative for prolonged bleeding, bruising easily, and swollen nodes.  No history of cancer.   ENDOCRINE: Negative for cold or heat intolerance, polydipsia or goiter.   PSYCH:  No depression or anxiety symptoms.   NEURO: As Above.   Vital Signs:  Ht 5\' 7"  (1.702 Taylor)   Wt 270 lb (122.5 kg)   BMI 42.29 kg/Taylor    General Medical Exam:  Well appearing, comfortable.  Nonlabored breathing.  No deformity or edema.  No rash.  Neurological Exam: MENTAL STATUS including orientation to time, place, person, recent and remote memory, attention span and concentration, language, and fund of knowledge is normal.  Speech is not dysarthric.  CRANIAL NERVES:  Normal conjugate,  extra-ocular eye movements in all directions of gaze.  No ptosis.  Normal facial symmetry and movements.  Normal shoulder shrug and head rotation.  Tongue is midline.  MOTOR:  Antigravity in all extremities.  No abnormal movements.  No pronator drift.   SENSORY & REFLEXES:  Unable to assess  COORDINATION/GAIT: Normal finger to nose bilaterally.  Intact rapid alternating movements bilaterally.  Unable to rise from a chair without using arms.  Gait mildly wide based, small steps, stable and unassisted.   IMPRESSION: Bilateral lower leg burning pain, lumbosacral radiculopathy vs. Neuropathy.  Unlikely neuropathy as her symptoms only involve the anterior shins and neuropathy tends to be circumferential.  She could have L4-5 radiculopathy which could manifest with burning pain in this location Recommend starting physical therapy She has tried gabapentin, Lyrica, amitriptyline, and Cymbalta with no benefit for pain. For refractory pain, she is appropriate to see pain management -  patient informed my office does not manage chronic pain I will see her back in the office, if there is no improvement, next step is MRI lumbar spine   Follow Up Instructions:  I discussed the assessment and treatment plan with the patient. The patient was provided an opportunity to ask questions and all were answered. The patient agreed with the plan and demonstrated an understanding of the instructions.   The patient was advised to call back or seek an in-person evaluation if the symptoms worsen or if the condition fails to improve as anticipated.  Return to clinic in 6-8 weeks  Total Time spent:  45 min   [place], DO

## 2018-07-07 ENCOUNTER — Telehealth (INDEPENDENT_AMBULATORY_CARE_PROVIDER_SITE_OTHER): Payer: Medicare Other | Admitting: Neurology

## 2018-07-07 ENCOUNTER — Other Ambulatory Visit: Payer: Self-pay

## 2018-07-07 VITALS — Ht 67.0 in | Wt 270.0 lb

## 2018-07-07 DIAGNOSIS — M79605 Pain in left leg: Secondary | ICD-10-CM

## 2018-07-07 DIAGNOSIS — M5417 Radiculopathy, lumbosacral region: Secondary | ICD-10-CM

## 2018-07-07 DIAGNOSIS — M79604 Pain in right leg: Secondary | ICD-10-CM

## 2018-07-25 ENCOUNTER — Ambulatory Visit: Payer: Medicare Other | Attending: Neurology

## 2018-07-25 ENCOUNTER — Other Ambulatory Visit: Payer: Self-pay

## 2018-07-25 DIAGNOSIS — M545 Low back pain: Secondary | ICD-10-CM | POA: Diagnosis present

## 2018-07-25 DIAGNOSIS — M79605 Pain in left leg: Secondary | ICD-10-CM | POA: Diagnosis present

## 2018-07-25 DIAGNOSIS — G8929 Other chronic pain: Secondary | ICD-10-CM | POA: Insufficient documentation

## 2018-07-25 DIAGNOSIS — M79604 Pain in right leg: Secondary | ICD-10-CM | POA: Insufficient documentation

## 2018-07-25 DIAGNOSIS — M62830 Muscle spasm of back: Secondary | ICD-10-CM | POA: Diagnosis present

## 2018-07-25 NOTE — Therapy (Signed)
[place] [facility] [address] [place], [place], [address] Phone: [phone]   Fax:  [phone]  Physical Therapy Evaluation  Patient Details  Name: Yvonne Taylor MRN: [id number] Date of Birth: 1950-04-17 Referring Provider (PT): Nita SicklePatel, Donika K   Encounter Date: 07/25/2018  PT End of Session - 07/25/18 1611    Visit Number  1    Number of Visits  13    Date for PT Re-Evaluation  09/05/18    PT Start Time  1430    PT Stop Time  1530    PT Time Calculation (min)  60 min    Activity Tolerance  Patient tolerated treatment well;No increased pain;Patient limited by fatigue;Patient limited by pain    Behavior During Therapy  [facility] tasks assessed/performed       Past Medical History:  Diagnosis Date   Arthritis    Chronic pain syndrome    GERD (gastroesophageal reflux disease)    Hypertension    Migraines     Past Surgical History:  Procedure Laterality Date   ABDOMINAL HYSTERECTOMY     ANKLE CLOSED REDUCTION     carpel tunnel     neck fusion     spinal allograft      There were no vitals filed for this visit.   Subjective Assessment - 07/25/18 1558    Subjective  Pt is a 68 y/o right-handed F who presents PT with a medical dx of bilateral leg pain. Pts chief complaint is currently bilateral, anterior shooting pain in her LE (L=R). Pt describes her pain from her bilateral hips to her knees as a dull pain, then her knees to ankles pain as a shooting, burning pain. Her pain initially presented in 2016 s/p a fall. Pts hip pain is relatively new, with onset in the past year. Pts PMH includes HTN and peripheral neuropathy. Pt reports a worse and current 10/10 pain, with relieving factors which include pain medications (temporary relief), cold packs, topical spray, lying on her back, and sitting. Pts aggravating factors include ambulation, standing, and sitting on firmer surfaces, where she reports having  numbing occurred x2 bilaterally. Pt currently sleeps in her recliner and gets 2-3 hours of sleep daily. Pt denies unremitting night pain, loss of sacral sensation, and b/b dysfunction. Pt currently lives alone with 2 STE with bilateral handrails. Pt reports that her daughter comes over daily to help with ADLs and can support her PRN. Pt does not currently use AD to ambulate. Pt has current PT goals of decreasing her bilateral leg pain and swelling.    Pertinent History  HTN, peripheral neuropathy    Limitations  Lifting;Standing;House hold activities;Walking    How long can you sit comfortably?  Indefinitely in recliner    How long can you walk comfortably?  30-45 minutes    Diagnostic tests  NCV    Patient Stated Goals  Decrease pain and knee swelling (L>R)    Currently in Pain?  Yes    Pain Score  10-Worst pain ever    Pain Location  Leg    Pain Orientation  Left;Right;Anterior;Lower    Pain Descriptors / Indicators  Burning;Dull    Pain Type  Chronic pain;Neuropathic pain    Pain Radiating Towards  Radiates anteriorly to ankles    Pain Onset  More than a month ago    Pain Frequency  Constant    Aggravating Factors   Walking, standing, lying prone  Pain Relieving Factors  Topical spray, cold packs, sitting, lying on back, pain medications    Effect of Pain on Daily Activities  Decreases her physical activity and ADLs         [facility] PT Assessment - 07/25/18 1546      Assessment   Medical Diagnosis  Bilateral leg pain    Referring Provider (PT)  Narda Amber K    Onset Date/Surgical Date  01/11/14    Hand Dominance  Right      Precautions   Precautions  None      Restrictions   Weight Bearing Restrictions  No      Balance Screen   Has the patient fallen in the past 6 months  No    Has the patient had a decrease in activity level because of a fear of falling?   Yes    Is the patient reluctant to leave their home because of a fear of falling?   No      Home Environment    Living Environment  Private residence    Living Arrangements  Alone    Available Help at Discharge  Family;Neighbor;Friend(s)    Type of Home  House    Home Access  Level entry    Home Layout  One level;Able to live on main level with bedroom/bathroom;Full bath on main level    [place] - 2 wheels;Walker - standard;Grab bars - toilet;Grab bars - tub/shower;Wheelchair - manual    Additional Comments  Does not use AD      Prior Function   Level of Independence  Independent with basic ADLs    Leisure  Loves karaoke, dancing      Cognition   Overall Cognitive Status  Within Functional Limits for tasks assessed      Observation/Other Assessments   Other Surveys   Activities of Balance and Confidence Scale;Modified Oswestry    Activities of Balance Confidence Scale (ABC Scale)   45.6    Modified Oswestry  50      Observation/Other Assessments-Edema    Edema  --   left knee effusion - did not assess     Functional Tests   Functional tests  Sit to Stand      Sit to Stand   Comments  Requires increased time, increased reliance of bilateral UE      Posture/Postural Control   Posture/Postural Control  Postural limitations    Postural Limitations  Rounded Shoulders;Forward head;Flexed trunk      ROM / Strength   AROM / PROM / Strength  Strength      Strength   Overall Strength  Deficits    Strength Assessment Site  Hip;Knee;Ankle    Right/Left Hip  Right;Left    Right Hip Flexion  3+/5    Left Hip Flexion  4/5    Right/Left Knee  Right;Left    Right Knee Extension  4/5    Left Knee Extension  4+/5    Right/Left Ankle  Right;Left    Right Ankle Dorsiflexion  5/5    Left Ankle Dorsiflexion  5/5      Flexibility   Soft Tissue Assessment /Muscle Length  no      Palpation   Palpation comment  TTP, muscular guarding lumbar and bilateral hip regions      Bed Mobility   Bed Mobility  --   Requires bilateral UE use and min-mod assist     Transfers   Transfers   Sit  to Stand    Comments  Requires increased time and UE support      Ambulation/Gait   Ambulation/Gait  Yes    Gait Pattern  Step-to pattern;Decreased stride length;Wide base of support;Trunk flexed;Poor foot clearance - left;Poor foot clearance - right    Ambulation Surface  Level    Gait Comments  Decreased gait velocity, stride length, clearance/heel strike bilaterally       Objective measurements completed on examination: See above findings.     Manual Therapy to address and improve her pain response and muscular spasms/tightness.  Central PA joint mobilizations L2-S2, Grades I-II Soft tissue mobilizations (compression, distraction) lumbar and gluteal/hip regions. Decreased pain response with increased exposure.  Supine bridging 2x8  At the end of the session, pt reported an improvement in pain, citing a current 7/10, and self-reporting that her walking improved.         PT Education - 07/25/18 1609    Education Details  Pt educated on technique/form of exercise and prescribed an HEP: supine bridging 2x8. Pt given education on plan of care and overall activity recommendations for home. All questions and concerns were addressed.    Person(s) Educated  Patient    Methods  Explanation;Tactile cues;Verbal cues    Comprehension  Verbalized understanding;Returned demonstration;Verbal cues required;Tactile cues required;Need further instruction       PT Short Term Goals - 07/25/18 1612      PT SHORT TERM GOAL #1   Title  Pt will be independent and compliant with her HEP.    Time  2    Period  Weeks    Status  New    Target Date  08/08/18        PT Long Term Goals - 07/25/18 1613      PT LONG TERM GOAL #1   Title  Pt will reduce her pain score to 4/10 on the NPS.    Baseline  10/10    Time  4    Period  Weeks    Status  New    Target Date  08/22/18      PT LONG TERM GOAL #2   Title  Pt will score a 40% on the modified ODI to demonstrate a significant  improvement in disability and function.    Baseline  50%    Time  6    Period  Weeks    Status  New    Target Date  09/05/18      PT LONG TERM GOAL #3   Title  Pt will score a 30% or less on the ABC Scale to demonstrate improved confidence with her balance and functional mobility.    Baseline  45.6%    Time  6    Period  Weeks    Status  New    Target Date  09/05/18             Plan - 07/25/18 1621    Clinical Impression Statement  Pt is a 68 y/o right-handed F who presents to PT with primary PT dx of bilateral LE pain, with secondary dx of LBP and muscular spasms of B LE. Pt demonstrates B LE dysfunction, as indicated by strength MMT scores of L hip flexion 4/5, R hip flexion 3+/5 and her observed functional mobility quality (ambulation, sit to stands, bed mobility) and mechanics. Pt has possible LBP involvement due to her modified ODI score (50%, moderate disability), concordant sx arising from target manual therapy interventions at L2-S2, as  well as exacerbation with prone on elbows and prone positioning. Pt demonstrated increased muscular spasms and guarding with manual therapy (soft tissue mobilizations and PA joint mobilizations centrally), which improved with increased exposure. Pt demonstrated decreased activity tolerance with bed mobility (rolling B, sit<>supine), requiring extra time and physical assist of min-modA. Pt reported having difficulty with all functional mobility, citing that she strains her B UE and lower back to get into/out of bed, though she attempts to not use her UE usually. Pt responded well to supine bridging, citing a decreased pain response and difficulty with practice and increased exposure. Pts ambulation demonstrated wider BOS, decreased heel strike, stride length, foot clearance, all bilaterally. Lastly, pt demonstrates impaired balance, as supported by her ABC Scale reported her lack of confidence in ambulating at home, reaching forward, and ambulating  throughout the community due to fearfulness. Pt will benefit from skilled therapy treatment to address and improve her aforementioned impairments, which will allow her to return to a high level of independence and improve her overall quality of life.    Personal Factors and Comorbidities  Age;Comorbidity 1;Comorbidity 2;Fitness;Past/Current Experience;Time since onset of injury/illness/exacerbation    Comorbidities  Peripheral neuropathy, HTN    Examination-Activity Limitations  Bed Mobility;Bend;Locomotion Level;Squat;Sleep;Lift;Transfers;Stand    Examination-Participation Restrictions  Community Activity;Shop    Stability/Clinical Decision Making  Stable/Uncomplicated    Clinical Decision Making  Low    Rehab Potential  Good    PT Frequency  2x / week    PT Duration  6 weeks    PT Treatment/Interventions  ADLs/Self Care Home Management;Cryotherapy;Moist Heat;Gait training;Stair training;Functional mobility training;Therapeutic activities;Neuromuscular re-education;Balance training;Therapeutic exercise;Patient/family education;Manual techniques;Passive range of motion;Joint Manipulations    PT Next Visit Plan  Assess static/dynamic balance, progress HEP, focus on strengthening/endurance exercise    PT Home Exercise Plan  See education section.    Consulted and Agree with Plan of Care  Patient       Patient will benefit from skilled therapeutic intervention in order to improve the following deficits and impairments:  Abnormal gait, Cardiopulmonary status limiting activity, Decreased balance, Decreased activity tolerance, Decreased endurance, Decreased mobility, Decreased range of motion, Decreased strength, Increased edema, Increased muscle spasms, Impaired flexibility, Postural dysfunction, Pain, Improper body mechanics, Impaired sensation  Visit Diagnosis: 1. Pain in left leg   2. Pain in right leg   3. Muscle spasm of back   4. Chronic bilateral low back pain without sciatica         Problem List There are no active problems to display for this patient.   Sanda Lingerhomas Jones, SPT 07/26/2018, 1:24 PM  [place] [facility] [address] [place], [place], [address] Phone: [phone]   Fax:  [phone]  Name: Yvonne Taylor MRN: [id number] Date of Birth: [birth year]

## 2018-07-26 NOTE — Addendum Note (Signed)
Addended by: Blain Pais on: 07/26/2018 01:27 PM   Modules accepted: Orders

## 2018-07-27 ENCOUNTER — Ambulatory Visit: Payer: Medicare Other

## 2018-08-01 ENCOUNTER — Ambulatory Visit: Payer: Medicare Other

## 2018-08-08 ENCOUNTER — Ambulatory Visit: Payer: Medicare Other

## 2018-09-06 ENCOUNTER — Other Ambulatory Visit: Payer: Self-pay | Admitting: Family Medicine

## 2018-09-06 DIAGNOSIS — Z1231 Encounter for screening mammogram for malignant neoplasm of breast: Secondary | ICD-10-CM

## 2018-09-13 ENCOUNTER — Other Ambulatory Visit (INDEPENDENT_AMBULATORY_CARE_PROVIDER_SITE_OTHER): Payer: Self-pay | Admitting: Vascular Surgery

## 2018-09-13 DIAGNOSIS — I739 Peripheral vascular disease, unspecified: Secondary | ICD-10-CM

## 2018-09-14 ENCOUNTER — Encounter (INDEPENDENT_AMBULATORY_CARE_PROVIDER_SITE_OTHER): Payer: Self-pay

## 2018-09-14 ENCOUNTER — Ambulatory Visit (INDEPENDENT_AMBULATORY_CARE_PROVIDER_SITE_OTHER): Payer: Medicare Other

## 2018-09-14 ENCOUNTER — Encounter (INDEPENDENT_AMBULATORY_CARE_PROVIDER_SITE_OTHER): Payer: Self-pay | Admitting: Vascular Surgery

## 2018-09-14 ENCOUNTER — Other Ambulatory Visit: Payer: Self-pay

## 2018-09-14 ENCOUNTER — Ambulatory Visit (INDEPENDENT_AMBULATORY_CARE_PROVIDER_SITE_OTHER): Payer: Medicare Other | Admitting: Vascular Surgery

## 2018-09-14 VITALS — BP 171/83 | HR 73 | Resp 18 | Ht 67.0 in | Wt 270.0 lb

## 2018-09-14 DIAGNOSIS — I890 Lymphedema, not elsewhere classified: Secondary | ICD-10-CM | POA: Diagnosis not present

## 2018-09-14 DIAGNOSIS — I739 Peripheral vascular disease, unspecified: Secondary | ICD-10-CM

## 2018-09-14 DIAGNOSIS — I10 Essential (primary) hypertension: Secondary | ICD-10-CM

## 2018-09-18 NOTE — Progress Notes (Signed)
MRN : [id number]  Yvonne Deputymanda F Diana is a 68 y.o. (12-Mar-1950) female who presents with chief complaint of  Chief Complaint  Patient presents with   New Patient (Initial Visit)    ref Marvis MoellerMiles for claudication   .  History of Present Illness:   Patient is seen for evaluation of leg pain and leg swelling. The patient first noticed the swelling remotely. The swelling is associated with pain and discoloration. The pain and swelling worsens with prolonged dependency and improves with elevation. The pain is unrelated to activity.  The patient notes that in the morning the legs are significantly improved but they steadily worsened throughout the course of the day. The patient also notes a steady worsening of the discoloration in the ankle and shin area.   The patient denies claudication symptoms.  The patient denies symptoms consistent with rest pain.  The patient denies and extensive history of DJD and LS spine disease.  The patient has no had any past angiography, interventions or vascular surgery.  Elevation makes the leg symptoms better, dependency makes them much worse. There is no history of ulcerations. The patient denies any recent changes in medications.  The patient has not been wearing graduated compression.  The patient denies a history of DVT or PE. There is no prior history of phlebitis. There is no history of primary lymphedema.  No history of malignancies. No history of trauma or groin or pelvic surgery. There is no history of radiation treatment to the groin or pelvis  The patient denies amaurosis fugax or recent TIA symptoms. There are no recent neurological changes noted. The patient denies recent episodes of angina or shortness of breath  Current Meds  Medication Sig   carisoprodol (SOMA) 350 MG tablet Take 350 mg by mouth 3 (three) times daily.   HYDROcodone-acetaminophen (NORCO/VICODIN) 5-325 MG tablet Take 1 tablet by mouth every 6 (six) hours as needed for moderate  pain.   hydrOXYzine (ATARAX/VISTARIL) 25 MG tablet Take 25 mg by mouth 3 (three) times daily as needed.   ibuprofen (ADVIL) 800 MG tablet    omeprazole (PRILOSEC) 20 MG capsule Take 20 mg by mouth daily.   solifenacin (VESICARE) 5 MG tablet Take 5 mg by mouth daily.   valsartan (DIOVAN) 320 MG tablet Take 320 mg by mouth daily.    Past Medical History:  Diagnosis Date   Arthritis    Chronic pain syndrome    GERD (gastroesophageal reflux disease)    Hypertension    Migraines     Past Surgical History:  Procedure Laterality Date   ABDOMINAL HYSTERECTOMY     ANKLE CLOSED REDUCTION     carpel tunnel     neck fusion     spinal allograft      Social History Social History   Tobacco Use   Smoking status: Never Smoker   Smokeless tobacco: Never Used  Substance Use Topics   Alcohol use: No   Drug use: No    Family History Family History  Problem Relation Age of Onset   Hematuria Mother    Kidney cancer Neg Hx    Kidney disease Neg Hx    Prostate cancer Neg Hx     Allergies  Allergen Reactions   Tizanidine Anaphylaxis   Amitriptyline Swelling   Amlodipine Swelling   Augmentin [Amoxicillin-Pot Clavulanate] Swelling   Celebrex [Celecoxib] Swelling   Ciprofloxacin Swelling   Clonidine Derivatives Swelling   Cymbalta [Duloxetine Hcl] Swelling   Duloxetine Swelling  Elavil [Amitriptyline Hcl] Swelling   Fluoxetine Swelling   Hydralazine Swelling   Hydrocortisone Swelling   Lipitor [Atorvastatin] Swelling   Lyrica [Pregabalin] Swelling   Micardis [Telmisartan] Swelling   Mobic [Meloxicam] Swelling   Neurontin [Gabapentin] Swelling   Norvasc [Amlodipine Besylate] Swelling   Paxil [Paroxetine] Swelling   Prozac [Fluoxetine Hcl] Swelling   Vasotec [Enalapril Maleate] Swelling   Ropinirole Other (See Comments)     REVIEW OF SYSTEMS (Negative unless checked)  Constitutional: [] Weight loss  [] Fever   [] Chills Cardiac: [] Chest pain   [] Chest pressure   [] Palpitations   [] Shortness of breath when laying flat   [] Shortness of breath with exertion. Vascular:  [] Pain in legs with walking   [x] Pain in legs at rest  [] History of DVT   [] Phlebitis   [x] Swelling in legs   [] Varicose veins   [] Non-healing ulcers Pulmonary:   [] Uses home oxygen   [] Productive cough   [] Hemoptysis   [] Wheeze  [] COPD   [] Asthma Neurologic:  [] Dizziness   [] Seizures   [] History of stroke   [] History of TIA  [] Aphasia   [] Vissual changes   [] Weakness or numbness in arm   [] Weakness or numbness in leg Musculoskeletal:   [] Joint swelling   [] Joint pain   [] Low back pain Hematologic:  [] Easy bruising  [] Easy bleeding   [] Hypercoagulable state   [] Anemic Gastrointestinal:  [] Diarrhea   [] Vomiting  [] Gastroesophageal reflux/heartburn   [] Difficulty swallowing. Genitourinary:  [] Chronic kidney disease   [] Difficult urination  [] Frequent urination   [] Blood in urine Skin:  [] Rashes   [] Ulcers  Psychological:  [] History of anxiety   []  History of major depression.  Physical Examination  Vitals:   09/14/18 1454  BP: (!) 171/83  Pulse: 73  Resp: 18  Weight: 270 lb (122.5 kg)  Height: 5\' 7"  (1.702 m)   Body mass index is 42.29 kg/m. Gen: WD/WN, NAD Head: [place]/AT, No temporalis wasting.  Ear/Nose/Throat: Hearing grossly intact, nares w/o erythema or drainage Eyes: PER, EOMI, sclera nonicteric.  Neck: Supple, no large masses.   Pulmonary:  Good air movement, no audible wheezing bilaterally, no use of accessory muscles.  Cardiac: RRR, no JVD Vascular: scattered varicosities present bilaterally.  Mild venous stasis changes to the legs bilaterally.  3-4+ soft pitting edema Vessel Right Left  Radial Palpable Palpable  PT Not Palpable Not Palpable  DP Palpable Palpable  Gastrointestinal: Non-distended. No guarding/no peritoneal signs.  Musculoskeletal: M/S 5/5 throughout.  No deformity or atrophy.  Neurologic: CN 2-12 intact.  Symmetrical.  Speech is fluent. Motor exam as listed above. Psychiatric: Judgment intact, Mood & affect appropriate for pt's clinical situation. Dermatologic: No rashes or ulcers noted.  No changes consistent with cellulitis. Lymph : No lichenification or skin changes of chronic lymphedema.  CBC Lab Results  Component Value Date   WBC 6.6 11/05/2014   HGB 13.0 11/05/2014   HCT 39.1 11/05/2014   MCV 90.8 11/05/2014   PLT 376 11/05/2014    BMET    Component Value Date/Time   NA 138 11/05/2014 0952   K 4.4 11/05/2014 0952   CL 104 11/05/2014 0952   CO2 25 11/05/2014 0952   GLUCOSE 109 (H) 11/05/2014 0952   BUN 15 11/05/2014 0952   CREATININE 0.96 11/05/2014 0952   CALCIUM 9.0 11/05/2014 0952   GFRNONAA >60 11/05/2014 0952   GFRAA >60 11/05/2014 0952   CrCl cannot be calculated (Patient's most recent lab result is older than the maximum 21 days allowed.).  COAG Lab Results  Component Value Date   INR 0.9 11/04/2006    Radiology Vas [place] Vanice Sarah With/wo Tbi  Result Date: 09/14/2018 LOWER EXTREMITY DOPPLER STUDY Indications: Bilat swelling and pain.  Performing Technologist: Salvadore Farber RVT  Examination Guidelines: A complete evaluation includes at minimum, Doppler waveform signals and systolic blood pressure reading at the level of bilateral brachial, anterior tibial, and posterior tibial arteries, when vessel segments are accessible. Bilateral testing is considered an integral part of a complete examination. Photoelectric Plethysmograph (PPG) waveforms and toe systolic pressure readings are included as required and additional duplex testing as needed. Limited examinations for reoccurring indications may be performed as noted.  ABI Findings: +---------+------------------+-----+---------+--------+  Right     Rt Pressure (mmHg) Index Waveform  Comment   +---------+------------------+-----+---------+--------+  Brachial  167                                           +---------+------------------+-----+---------+--------+  ATA       181                1.08  triphasic           +---------+------------------+-----+---------+--------+  PTA       188                1.13  triphasic           +---------+------------------+-----+---------+--------+  Great Toe 144                0.86  Normal              +---------+------------------+-----+---------+--------+ +---------+------------------+-----+---------+-------+  Left      Lt Pressure (mmHg) Index Waveform  Comment  +---------+------------------+-----+---------+-------+  Brachial  165                                         +---------+------------------+-----+---------+-------+  ATA       179                1.07  triphasic          +---------+------------------+-----+---------+-------+  PTA       189                1.13  triphasic          +---------+------------------+-----+---------+-------+  Great Toe 129                0.77  Normal             +---------+------------------+-----+---------+-------+ +-------+-----------+-----------+------------+------------+  ABI/TBI Today's ABI Today's TBI Previous ABI Previous TBI  +-------+-----------+-----------+------------+------------+  Right   1.13        .86                                    +-------+-----------+-----------+------------+------------+  Left    1.13        .77                                    +-------+-----------+-----------+------------+------------+  Summary: Right: Resting right ankle-brachial index is within normal range. No evidence of significant right lower extremity arterial disease. The right toe-brachial index is normal. Left: Resting  left ankle-brachial index is within normal range. No evidence of significant left lower extremity arterial disease. The left toe-brachial index is normal.  *See table(s) above for measurements and observations.  Electronically signed by Levora DredgeGregory Aracelia Brinson MD on 09/14/2018 at 5:17:54 PM.   Final       Assessment/Plan 1.  Lymphedema No surgery or intervention at this point in time.    I have had a long discussion with the patient regarding venous insufficiency and why it  causes symptoms. I have discussed with the patient the chronic skin changes that accompany venous insufficiency and the long term sequela such as infection and ulceration.  Patient will begin wearing graduated compression stockings class 1 (20-30 mmHg) or compression wraps on a daily basis a prescription was given. The patient will put the stockings on first thing in the morning and removing them in the evening. The patient is instructed specifically not to sleep in the stockings.    In addition, behavioral modification including several periods of elevation of the lower extremities during the day will be continued. I have demonstrated that proper elevation is a position with the ankles at heart level.  The patient is instructed to begin routine exercise, especially walking on a daily basis  Patient should undergo duplex ultrasound of the venous system to ensure that DVT or reflux is not present.  Following the review of the ultrasound the patient will follow up in 2-3 months to reassess the degree of swelling and the control that graduated compression stockings or compression wraps  is offering.   The patient can be assessed for a Lymph Pump at that time - VAS US LOWER EXTREMITY VENOUS (DVT); Future  2. Hypertension, essential Continue antihypertensive medications as already ordered, these medications have been reviewed and there are no changes at this time.     Levora DredgeGregory Encarnacion Bole, MD  09/18/2018 11:28 AM

## 2018-11-16 ENCOUNTER — Ambulatory Visit (INDEPENDENT_AMBULATORY_CARE_PROVIDER_SITE_OTHER): Payer: Medicare Other

## 2018-11-16 ENCOUNTER — Other Ambulatory Visit: Payer: Self-pay

## 2018-11-16 ENCOUNTER — Ambulatory Visit (INDEPENDENT_AMBULATORY_CARE_PROVIDER_SITE_OTHER): Payer: Medicare Other | Admitting: Nurse Practitioner

## 2018-11-16 ENCOUNTER — Encounter (INDEPENDENT_AMBULATORY_CARE_PROVIDER_SITE_OTHER): Payer: Self-pay | Admitting: Nurse Practitioner

## 2018-11-16 VITALS — BP 177/93 | HR 74 | Resp 21 | Ht 67.0 in | Wt 268.0 lb

## 2018-11-16 DIAGNOSIS — I890 Lymphedema, not elsewhere classified: Secondary | ICD-10-CM

## 2018-11-16 DIAGNOSIS — M19019 Primary osteoarthritis, unspecified shoulder: Secondary | ICD-10-CM

## 2018-11-16 DIAGNOSIS — I10 Essential (primary) hypertension: Secondary | ICD-10-CM | POA: Diagnosis not present

## 2018-11-20 ENCOUNTER — Encounter (INDEPENDENT_AMBULATORY_CARE_PROVIDER_SITE_OTHER): Payer: Self-pay | Admitting: Nurse Practitioner

## 2018-11-20 NOTE — Progress Notes (Signed)
SUBJECTIVE:  Patient ID: Yvonne Deputymanda F Bolduc, female    DOB: 09/25/1950, 68 y.o.   MRN: [id number] Chief Complaint  Patient presents with  . Follow-up    HPI  Yvonne Taylor is a 68 y.o. female The patient returns to the office for followup evaluation regarding leg swelling.  The swelling has persisted and the pain associated with swelling continues. There have not been any interval development of a ulcerations or wounds.  Since the previous visit the patient has been wearing graduated compression stockings and has noted little if any improvement in the lymphedema. The patient has been using compression routinely morning until night.  The patient also states elevation during the day and exercise is being done too.  The patient underwent a lower extremity venous reflux study and found to have no evidence of chronic venous insufficiency bilaterally.  Patient no evidence of DVT or superficial venous thrombosis bilaterally.  Past Medical History:  Diagnosis Date  . Arthritis   . Chronic pain syndrome   . GERD (gastroesophageal reflux disease)   . Hypertension   . Migraines     Past Surgical History:  Procedure Laterality Date  . ABDOMINAL HYSTERECTOMY    . ANKLE CLOSED REDUCTION    . carpel tunnel    . neck fusion    . spinal allograft      Social History   Socioeconomic History  . Marital status: Married    Spouse name: Not on file  . Number of children: 2  . Years of education: Not on file  . Highest education level: Not on file  Occupational History  . Not on file  Social Needs  . Financial resource strain: Not on file  . Food insecurity    Worry: Not on file    Inability: Not on file  . Transportation needs    Medical: Not on file    Non-medical: Not on file  Tobacco Use  . Smoking status: Never Smoker  . Smokeless tobacco: Never Used  Substance and Sexual Activity  . Alcohol use: No  . Drug use: No  . Sexual activity: Not on file  Lifestyle  . Physical  activity    Days per week: Not on file    Minutes per session: Not on file  . Stress: Not on file  Relationships  . Social Musicianconnections    Talks on phone: Not on file    Gets together: Not on file    Attends religious service: Not on file    Active member of club or organization: Not on file    Attends meetings of clubs or organizations: Not on file    Relationship status: Not on file  . Intimate partner violence    Fear of current or ex partner: Not on file    Emotionally abused: Not on file    Physically abused: Not on file    Forced sexual activity: Not on file  Other Topics Concern  . Not on file  Social History Narrative   Lives alone      No steps in home      Right handed      Highest level of edu- 10th grade       Family History  Problem Relation Age of Onset  . Hematuria Mother   . Kidney cancer Neg Hx   . Kidney disease Neg Hx   . Prostate cancer Neg Hx     Allergies  Allergen Reactions  . Tizanidine Anaphylaxis  .  Amitriptyline Swelling  . Amlodipine Swelling  . Augmentin [Amoxicillin-Pot Clavulanate] Swelling  . Celebrex [Celecoxib] Swelling  . Ciprofloxacin Swelling  . Clonidine Derivatives Swelling  . Cymbalta [Duloxetine Hcl] Swelling  . Duloxetine Swelling  . Elavil [Amitriptyline Hcl] Swelling  . Fluoxetine Swelling  . Hydralazine Swelling  . Hydrocortisone Swelling  . Lipitor [Atorvastatin] Swelling  . Lyrica [Pregabalin] Swelling  . Micardis [Telmisartan] Swelling  . Mobic [Meloxicam] Swelling  . Neurontin [Gabapentin] Swelling  . Norvasc [Amlodipine Besylate] Swelling  . Paxil [Paroxetine] Swelling  . Prozac [Fluoxetine Hcl] Swelling  . Vasotec [Enalapril Maleate] Swelling  . Ropinirole Other (See Comments)     Review of Systems   Review of Systems: Negative Unless Checked Constitutional: [] Weight loss  [] Fever  [] Chills Cardiac: [] Chest pain   []  Atrial Fibrillation  [] Palpitations   [] Shortness of breath when laying flat    [] Shortness of breath with exertion. [] Shortness of breath at rest Vascular:  [] Pain in legs with walking   [] Pain in legs with standing [] Pain in legs when laying flat   [] Claudication    [] Pain in feet when laying flat    [] History of DVT   [] Phlebitis   [x] Swelling in legs   [] Varicose veins   [] Non-healing ulcers Pulmonary:   [] Uses home oxygen   [] Productive cough   [] Hemoptysis   [] Wheeze  [] COPD   [] Asthma Neurologic:  [] Dizziness   [] Seizures  [] Blackouts [] History of stroke   [] History of TIA  [] Aphasia   [] Temporary Blindness   [] Weakness or numbness in arm   [] Weakness or numbness in leg Musculoskeletal:   [] Joint swelling   [] Joint pain   [] Low back pain  []  History of Knee Replacement [x] Arthritis [] back Surgeries  []  Spinal Stenosis    Hematologic:  [] Easy bruising  [] Easy bleeding   [] Hypercoagulable state   [] Anemic Gastrointestinal:  [] Diarrhea   [] Vomiting  [x] Gastroesophageal reflux/heartburn   [] Difficulty swallowing. [] Abdominal pain Genitourinary:  [] Chronic kidney disease   [] Difficult urination  [] Anuric   [] Blood in urine [] Frequent urination  [] Burning with urination   [] Hematuria Skin:  [] Rashes   [] Ulcers [] Wounds Psychological:  [] History of anxiety   []  History of major depression  []  Memory Difficulties      OBJECTIVE:   Physical Exam  BP (!) 177/93 (BP Location: Right Wrist)   Pulse 74   Resp (!) 21   Ht 5\' 7"  (1.702 m)   Wt 268 lb (121.6 kg)   BMI 41.97 kg/m   Gen: WD/WN, NAD Head: [place]/AT, No temporalis wasting.  Ear/Nose/Throat: Hearing grossly intact, nares w/o erythema or drainage Eyes: PER, EOMI, sclera nonicteric.  Neck: Supple, no masses.  No JVD.  Pulmonary:  Good air movement, no use of accessory muscles.  Cardiac: RRR Vascular:  3+ soft edema bilaterally, Vessel Right Left  Radial Palpable Palpable  Dorsalis Pedis Palpable Palpable  Posterior Tibial Palpable Palpable   Gastrointestinal: soft, non-distended. No guarding/no peritoneal signs.   Musculoskeletal: M/S 5/5 throughout.  No deformity or atrophy.  Neurologic: Pain and light touch intact in extremities.  Symmetrical.  Speech is fluent. Motor exam as listed above. Psychiatric: Judgment intact, Mood & affect appropriate for pt's clinical situation. Dermatologic:  Bilateral stasis dermatitis.  No changes consistent with cellulitis. Lymph : No Cervical lymphadenopathy, bilateral dermal thickening       ASSESSMENT AND PLAN:  1. Lymphedema Recommend:  No surgery or intervention at this point in time.    I have reviewed my previous discussion with the patient  regarding swelling and why it causes symptoms.  Patient will continue wearing graduated compression stockings class 1 (20-30 mmHg) on a daily basis. The patient will  beginning wearing the stockings first thing in the morning and removing them in the evening. The patient is instructed specifically not to sleep in the stockings.    In addition, behavioral modification including several periods of elevation of the lower extremities during the day will be continued.  This was reviewed with the patient during the initial visit.  The patient will also continue routine exercise, especially walking on a daily basis as was discussed during the initial visit.    Despite conservative treatments including graduated compression therapy class 1 and behavioral modification including exercise and elevation the patient  has not obtained adequate control of the lymphedema.  The patient still has stage 3 lymphedema and therefore, I believe that a lymph pump should be added to improve the control of the patient's lymphedema.  Additionally, a lymph pump is warranted because it will reduce the risk of cellulitis and ulceration in the future.  Patient should follow-up in six months    2. Hypertension, essential Continue antihypertensive medications as already ordered, these medications have been reviewed and there are no changes at this time.    3. Localized, primary osteoarthritis of shoulder region, unspecified laterality Continue NSAID medications as already ordered, these medications have been reviewed and there are no changes at this time.  Continued activity and therapy was stressed.    Current Outpatient Medications on File Prior to Visit  Medication Sig Dispense Refill  . carisoprodol (SOMA) 350 MG tablet Take 350 mg by mouth 3 (three) times daily.    . hydrOXYzine (ATARAX/VISTARIL) 25 MG tablet Take 25 mg by mouth 3 (three) times daily as needed.    Marland Kitchen ibuprofen (ADVIL) 800 MG tablet     . omeprazole (PRILOSEC) 20 MG capsule Take 20 mg by mouth daily.    . solifenacin (VESICARE) 5 MG tablet Take 5 mg by mouth daily.    . valsartan (DIOVAN) 320 MG tablet Take 320 mg by mouth daily.    Marland Kitchen HYDROcodone-acetaminophen (NORCO/VICODIN) 5-325 MG tablet Take 1 tablet by mouth every 6 (six) hours as needed for moderate pain.    . vitamin B-12 (CYANOCOBALAMIN) 500 MCG tablet Take 500 mcg by mouth daily.     No current facility-administered medications on file prior to visit.     There are no Patient Instructions on file for this visit. No follow-ups on file.   Georgiana Spinner, NP  This note was completed with Office manager.  Any errors are purely unintentional.

## 2019-02-01 ENCOUNTER — Telehealth (INDEPENDENT_AMBULATORY_CARE_PROVIDER_SITE_OTHER): Payer: Self-pay

## 2019-02-01 NOTE — Telephone Encounter (Signed)
Patient called and left a message wanting to find out about the lymphedema pump that was ordered for her. I returned the call and let her know that I had reached out to [employer]. That I had refaxed the information so they should be contacting her in the next couple of days.

## 2019-05-17 ENCOUNTER — Ambulatory Visit (INDEPENDENT_AMBULATORY_CARE_PROVIDER_SITE_OTHER): Payer: Medicare Other | Admitting: Vascular Surgery

## 2019-07-04 ENCOUNTER — Other Ambulatory Visit: Payer: Self-pay | Admitting: Neurosurgery

## 2019-07-04 DIAGNOSIS — M48062 Spinal stenosis, lumbar region with neurogenic claudication: Secondary | ICD-10-CM

## 2019-07-24 ENCOUNTER — Other Ambulatory Visit: Payer: Medicare Other

## 2019-07-26 ENCOUNTER — Other Ambulatory Visit: Payer: Self-pay

## 2019-07-26 ENCOUNTER — Ambulatory Visit
Admission: RE | Admit: 2019-07-26 | Discharge: 2019-07-26 | Disposition: A | Discharge status: Home / Self Care | Payer: Medicare HMO | Source: Ambulatory Visit | Attending: Neurosurgery | Admitting: Neurosurgery

## 2019-07-26 DIAGNOSIS — M48062 Spinal stenosis, lumbar region with neurogenic claudication: Secondary | ICD-10-CM

## 2020-11-05 IMAGING — MR MR LUMBAR SPINE W/O CM
4 of 7 series · 17 of 48 positions shown · non-contrast
Comparison: None.

CLINICAL DATA: Low back pain with bilateral leg pain.

EXAM:
MRI LUMBAR SPINE WITHOUT CONTRAST
TECHNIQUE: Multiplanar, multisequence MR imaging of the lumbar spine was
performed. No intravenous contrast was administered.

[Series 5: T2 · sagittal · 4.0mm · 0.73mm/px · 4 of 18 slices shown (1 of 2)]
[im 1/18]
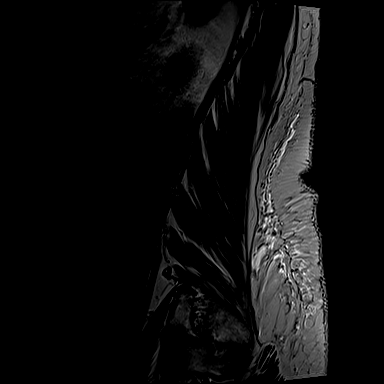
[im 6/18]
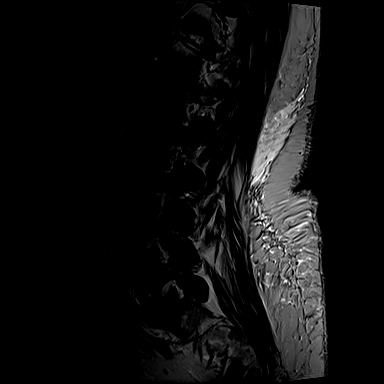
[im 12/18]
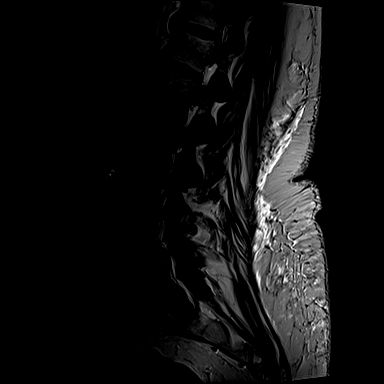
[im 18/18]
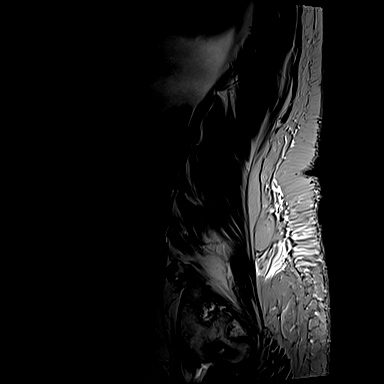

[Series 6: T1 · sagittal · 4.0mm · 0.73mm/px · 3 of 18 slices shown (1 of 2)]
[im 1/18]
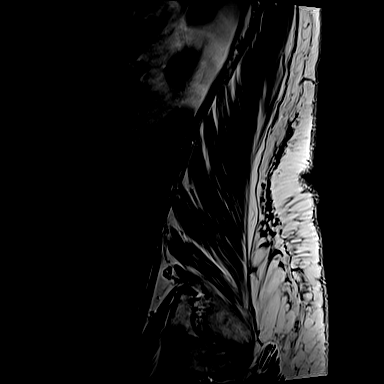
[im 12/18]
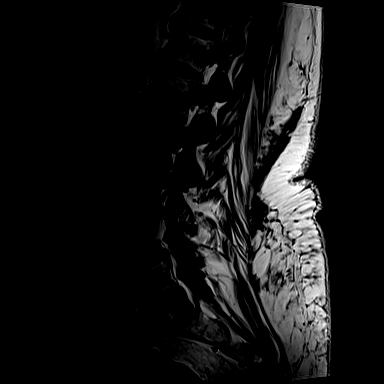
[im 18/18]
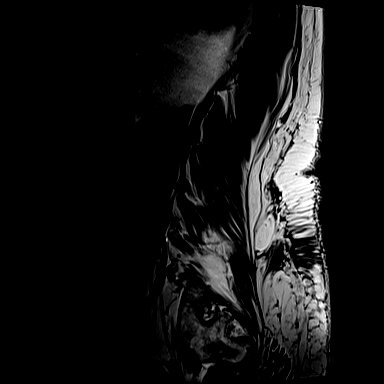

[Series 8: T1 · axial · 4.0mm · 0.28mm/px · z∈[+34,+119]mm · 3 of 22 slices shown (2 of 2)]
[im 5/22]
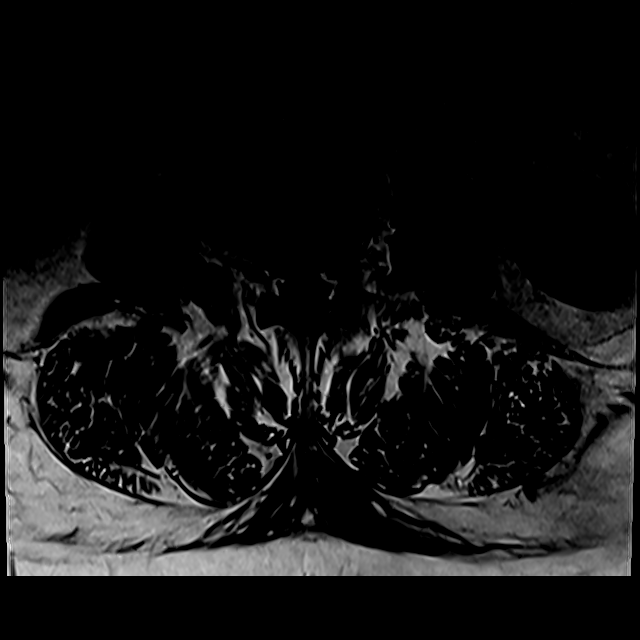
[im 13/22]
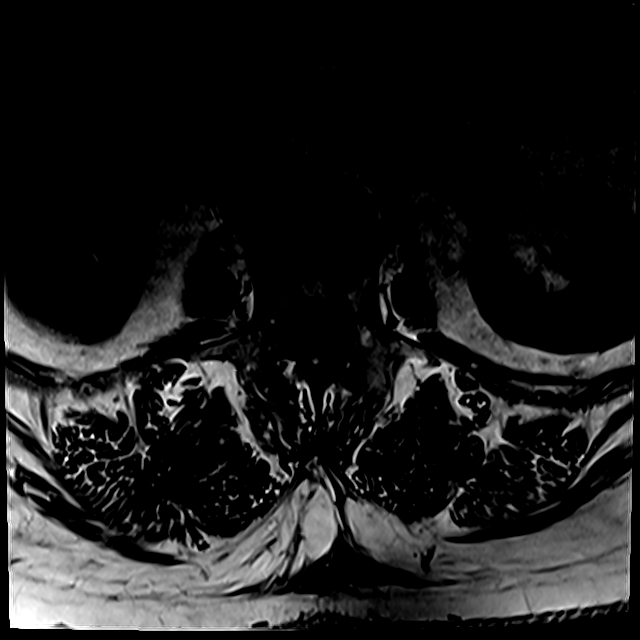
[im 22/22]
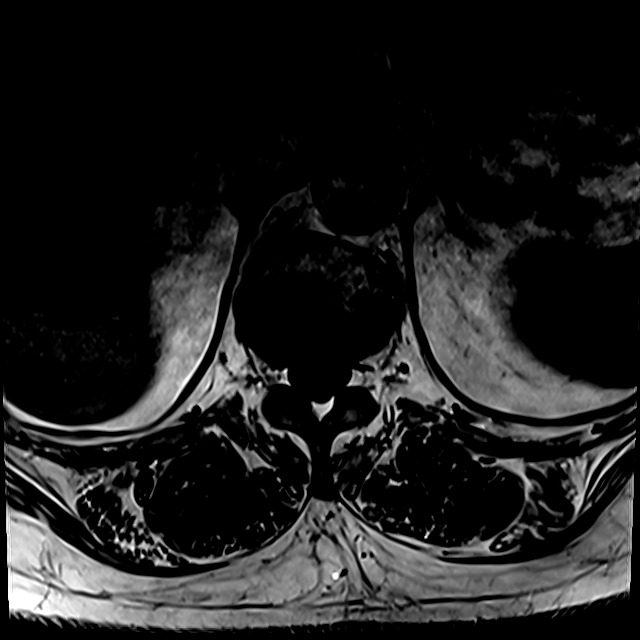

[Series 13: T2 · axial · 4.0mm · 0.28mm/px · z∈[-108,+94]mm · 7 of 46 slices shown (2 of 2)]
[im 1/46]
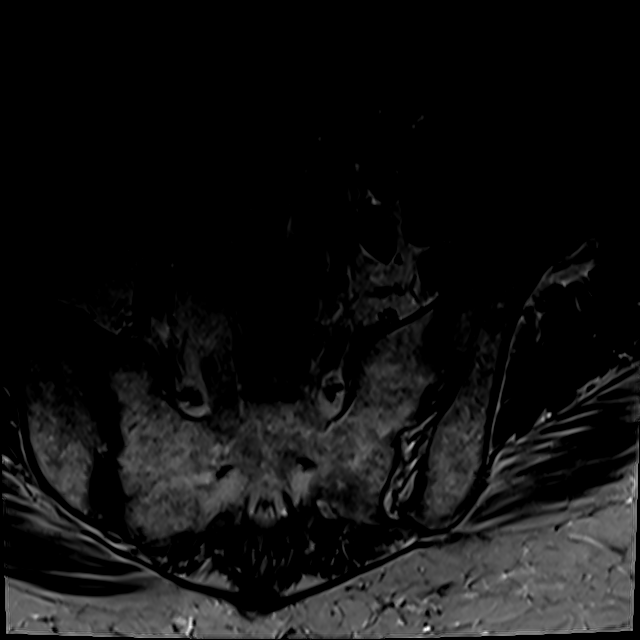
[im 9/46]
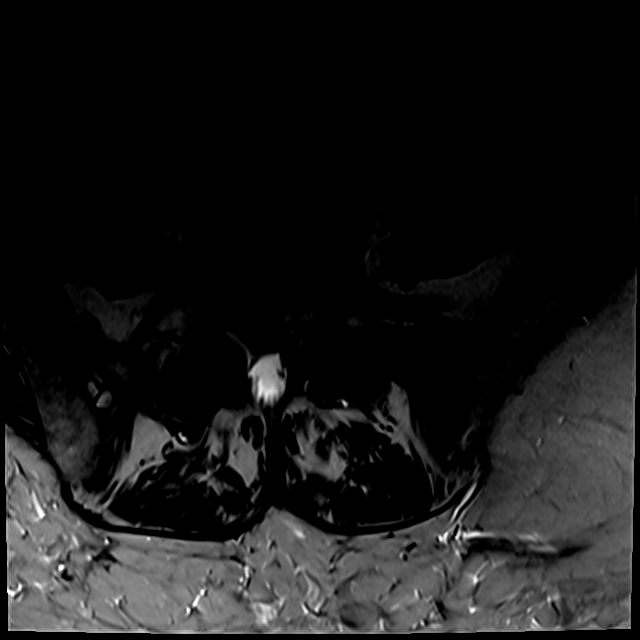
[im 13/46]
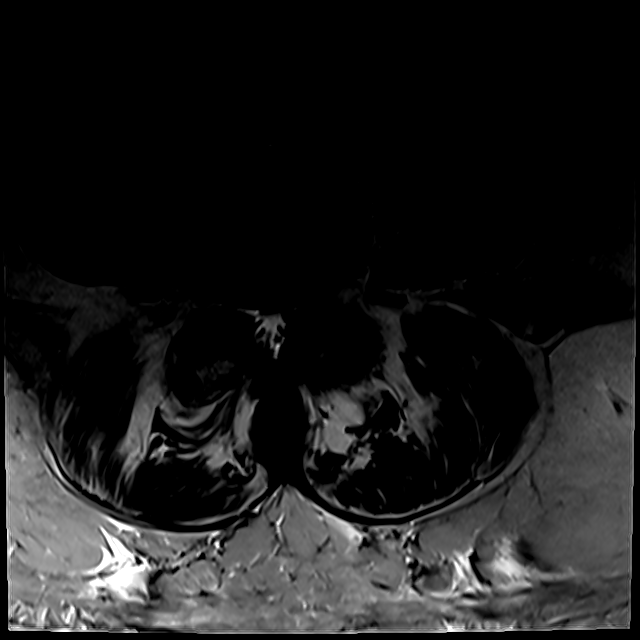
[im 21/46]
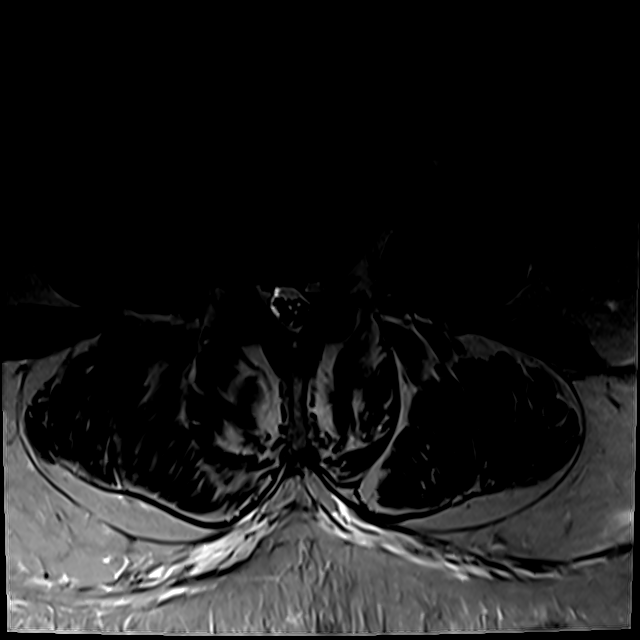
[im 25/46]
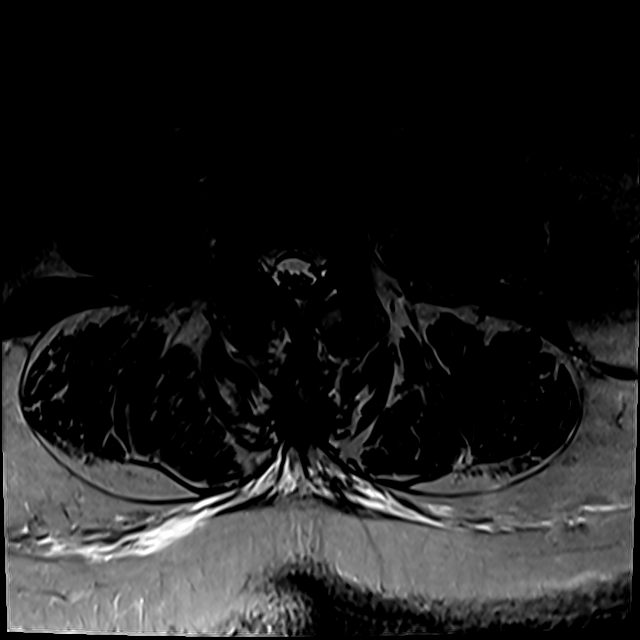
[im 33/46]
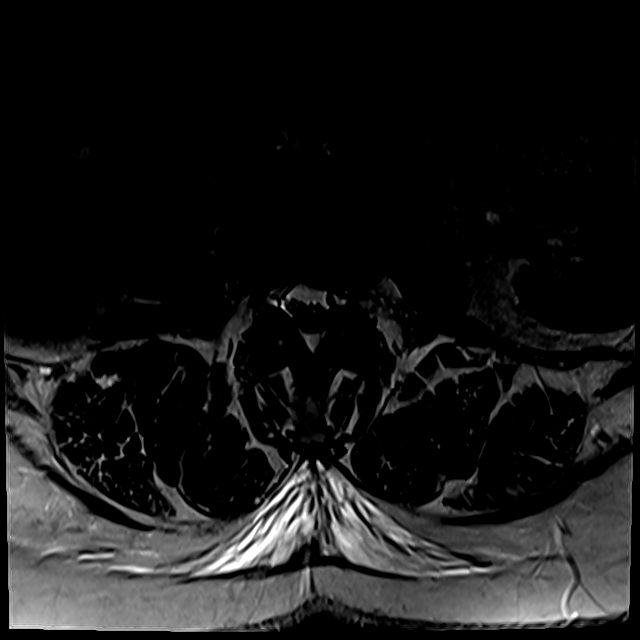
[im 41/46]
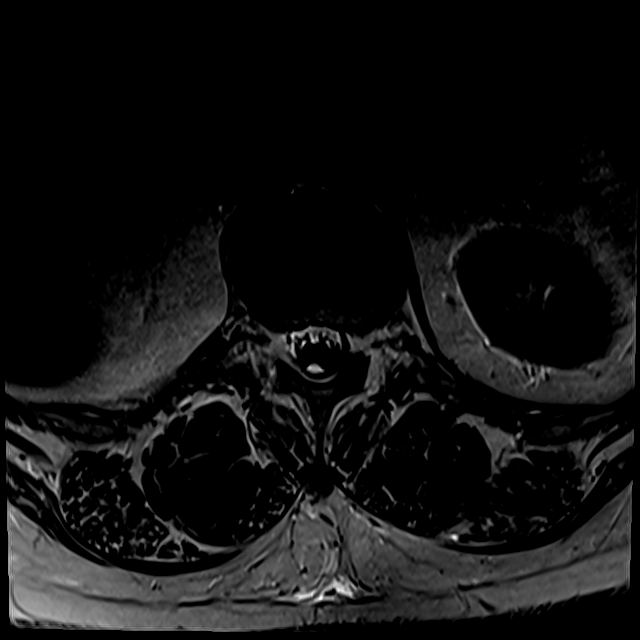

[17 of 48 positions shown; findings below may reference images not displayed]

FINDINGS: Segmentation:  Normal

Alignment: Mild anterolisthesis L4-5. Normal catch that remaining
alignment normal.

Vertebrae:  Negative for fracture or mass.  Hemangiomas T12 and L4.

Conus medullaris and cauda equina: Conus extends to the L1-2 level.
Conus and cauda equina appear normal.

Paraspinal and other soft tissues: Negative for paraspinous mass or
adenopathy.

Disc levels:

T12-L1: Mild disc and facet degeneration.  Negative for stenosis.

L1-2: Small left paracentral disc protrusion. Mild facet
degeneration. No significant stenosis or neural impingement.

L2-3: Mild disc degeneration and disc bulging. Bilateral facet
hypertrophy. Mild subarticular stenosis bilaterally.

L3-4: Mild disc bulging and moderate facet hypertrophy. Mild spinal
stenosis and mild to moderate subarticular stenosis bilaterally.

L4-5: Slight anterolisthesis. Advanced facet degeneration
bilaterally. Mild disc bulging. Mild spinal stenosis and moderate
subarticular stenosis bilaterally.

L5-S1: Disc degeneration with mild disc bulging. Moderate to
advanced facet degeneration bilaterally. Moderate subarticular
stenosis bilaterally.
IMPRESSION: Multilevel disc and facet degeneration throughout the lumbar spine.
Multilevel spinal and subarticular stenosis bilaterally as described
above.

## 2023-03-17 ENCOUNTER — Other Ambulatory Visit: Payer: Self-pay | Admitting: Family Medicine

## 2023-03-17 DIAGNOSIS — Z780 Asymptomatic menopausal state: Secondary | ICD-10-CM

## 2023-03-30 LAB — COLOGUARD: COLOGUARD: NEGATIVE

## 2023-04-21 ENCOUNTER — Ambulatory Visit
Admission: RE | Admit: 2023-04-21 | Discharge: 2023-04-21 | Disposition: A | Discharge status: Home / Self Care | Source: Ambulatory Visit | Attending: Family Medicine | Admitting: Family Medicine

## 2023-04-21 DIAGNOSIS — Z780 Asymptomatic menopausal state: Secondary | ICD-10-CM | POA: Insufficient documentation

## 2023-12-04 ENCOUNTER — Other Ambulatory Visit: Payer: Self-pay

## 2023-12-04 ENCOUNTER — Emergency Department
Admission: EM | Admit: 2023-12-04 | Discharge: 2023-12-04 | Disposition: A | Discharge status: Home / Self Care | Attending: Emergency Medicine | Admitting: Emergency Medicine

## 2023-12-04 ENCOUNTER — Emergency Department

## 2023-12-04 DIAGNOSIS — R0789 Other chest pain: Secondary | ICD-10-CM | POA: Diagnosis present

## 2023-12-04 DIAGNOSIS — I10 Essential (primary) hypertension: Secondary | ICD-10-CM | POA: Insufficient documentation

## 2023-12-04 DIAGNOSIS — R079 Chest pain, unspecified: Secondary | ICD-10-CM

## 2023-12-04 LAB — BASIC METABOLIC PANEL WITH GFR
Anion gap: 8 (ref 5–15)
BUN: 17 mg/dL (ref 8–23)
CO2: 26 mmol/L (ref 22–32)
Calcium: 9 mg/dL (ref 8.9–10.3)
Chloride: 105 mmol/L (ref 98–111)
Creatinine, Ser: 1.09 mg/dL — ABNORMAL HIGH (ref 0.44–1.00)
GFR, Estimated: 53 mL/min — ABNORMAL LOW (ref >60)
Glucose, Bld: 102 mg/dL — ABNORMAL HIGH (ref 70–99)
Potassium: 4.4 mmol/L (ref 3.5–5.1)
Sodium: 139 mmol/L (ref 135–145)

## 2023-12-04 LAB — CBC
HCT: 36.1 % (ref 36.0–46.0)
Hemoglobin: 11.7 g/dL — ABNORMAL LOW (ref 12.0–15.0)
MCH: 30.4 pg (ref 26.0–34.0)
MCHC: 32.4 g/dL (ref 30.0–36.0)
MCV: 93.8 fL (ref 80.0–100.0)
Platelets: 368 K/uL (ref 150–400)
RBC: 3.85 MIL/uL — ABNORMAL LOW (ref 3.87–5.11)
RDW: 13.8 % (ref 11.5–15.5)
WBC: 8.6 K/uL (ref 4.0–10.5)
nRBC: 0 % (ref 0.0–0.2)

## 2023-12-04 LAB — TROPONIN T, HIGH SENSITIVITY: Troponin T High Sensitivity: <15 ng/L (ref 0–19)

## 2023-12-04 MED ORDER — LIDOCAINE 5 % EX PTCH: 1.0000 | MEDICATED_PATCH | Freq: Two times a day (BID) | CUTANEOUS | 0 refills | Status: AC | PRN

## 2023-12-04 NOTE — Discharge Instructions (Signed)
 Take your home meds as directed. Follow-up with your primary provider as needed.

## 2023-12-04 NOTE — ED Provider Notes (Signed)
 [facility] Emergency Department Provider Note     Event Date/Time   First MD Initiated Contact with Patient 12/04/23 1544     (approximate)   History   Chest Pain   HPI  Yvonne Taylor is a 73 y.o. female with a history of HTN, GERD, OA, and chronic pain syndrome, presents to the ED for evaluation of right-sided chest pain. She notes onset of symptoms for the last 3 weeks. She reports a call out to EMS to the home on 10/31, day of onset of her symptoms.  They reported a normal cardiac rhythm strip, and she declined transport at that time.  She reports that that normal EKG was her normal cardiac work-up from a few weeks ago. She describes reproducible chest wall pain on the right side of the chest wall. She notes the pain is aggravated by RUE movement and deep inspirations.  No reports any nausea, vomiting, shortness of breath, cough, or diaphoresis.  Physical Exam   Triage Vital Signs: ED Triage Vitals  Encounter Vitals Group     BP 12/04/23 1427 (!) 178/90     Girls Systolic BP Percentile --      Girls Diastolic BP Percentile --      Boys Systolic BP Percentile --      Boys Diastolic BP Percentile --      Pulse Rate 12/04/23 1427 63     Resp 12/04/23 1427 19     Temp 12/04/23 1427 98.3 F (36.8 C)     Temp Source 12/04/23 1427 Oral     SpO2 12/04/23 1427 100 %     Weight 12/04/23 1427 245 lb (111.1 kg)     Height 12/04/23 1427 5' 6 (1.676 m)     Head Circumference --      Peak Flow --      Pain Score 12/04/23 1426 7     Pain Loc --      Pain Education --      Exclude from Growth Chart --     Most recent vital signs: Vitals:   12/04/23 1427  BP: (!) 178/90  Pulse: 63  Resp: 19  Temp: 98.3 F (36.8 C)  SpO2: 100%    General Awake, no distress. NAD HEENT NCAT. PERRL. EOMI. No rhinorrhea. Mucous membranes are moist. **} CV:  Good peripheral perfusion. RRR RESP:  Normal effort. CTA ABD:  No distention.  MSK:  AROM of all  extremities.  Reproducible pain on palpation of the right anterior lateral chest wall.  Pain is also reproduced by active range of motion of the RUE. NEURO: Cranial nerves II to XII grossly intact.   ED Results / Procedures / Treatments   Labs (all labs ordered are listed, but only abnormal results are displayed) Labs Reviewed  BASIC METABOLIC PANEL WITH GFR - Abnormal; Notable for the following components:      Result Value   Glucose, Bld 102 (*)    Creatinine, Ser 1.09 (*)    GFR, Estimated 53 (*)    All other components within normal limits  CBC - Abnormal; Notable for the following components:   RBC 3.85 (*)    Hemoglobin 11.7 (*)    All other components within normal limits  TROPONIN T, HIGH SENSITIVITY    EKG  Vent. rate 67 BPM PR interval 176 ms QRS duration 68 ms QT/QTcB 412/435 ms P-R-T axes 53 15 15 Normal sinus rhythm Normal ECG No STEMI  RADIOLOGY  I personally viewed and evaluated these images as part of my medical decision making, as well as reviewing the written report by the radiologist.  ED Provider Interpretation: No acute finding  DG Chest 2 View Result Date: 12/04/2023 CLINICAL DATA:  chest pain EXAM: CHEST - 2 VIEW COMPARISON:  November 04, 2006 FINDINGS: The cardiomediastinal silhouette is mildly enlarged. No pleural effusion. No pneumothorax. No acute pleuroparenchymal abnormality. Visualized abdomen is unremarkable. Multilevel degenerative changes of the thoracic spine. IMPRESSION: No acute cardiopulmonary abnormality. Electronically Signed   By: Corean Salter M.D.   On: 12/04/2023 14:48     PROCEDURES:  Critical Care performed: No  Procedures   MEDICATIONS ORDERED IN ED: Medications - No data to display   IMPRESSION / MDM / ASSESSMENT AND PLAN / ED COURSE  I reviewed the triage vital signs and the nursing notes.                              Differential diagnosis includes, but is not limited to, ACS, aortic dissection,  pulmonary embolism, cardiac tamponade, pneumothorax, pneumonia, pericarditis, myocarditis, GI-related causes including esophagitis/gastritis, and musculoskeletal chest wall pain.     Patient's presentation is most consistent with acute complicated illness / injury requiring diagnostic workup.  Patient's diagnosis is consistent with nonspecific chest wall pain, likely representing a musculoskeletal etiology.  Geriatric patient with a reassuring exam and cardiac workup at this time.  Patient is endorsing 3 weeks of intermittent left chest wall pain aggravated by movement and deep inspiration.  Her exam is benign and reassuring.  No acute lab abnormalities are noted.  Troponin is normal at this time.  Patient does not endorse any active or current chest pain.  X-ray interpreted by me, shows no evidence of any intrathoracic process.  EKG interpretation shows no ACS, AMI, or other rhythm abnormality.  Patient's exam is overall reassuring.  Patient will be discharged home with prescriptions for Lidoderm  patches. Patient is to follow up with her primary provider as scheduled, as needed or otherwise directed. Patient is given ED precautions to return to the ED for any worsening or new symptoms.     FINAL CLINICAL IMPRESSION(S) / ED DIAGNOSES   Final diagnoses:  Nonspecific chest pain  Anterior chest wall pain     Rx / DC Orders   ED Discharge Orders          Ordered    lidocaine  (LIDODERM ) 5 %  Every 12 hours PRN        12/04/23 1731             Note:  This document was prepared using Dragon voice recognition software and may include unintentional dictation errors.    Loyd Candida LULLA Aldona, PA-C 12/04/23 1731    Dorothyann Drivers, MD 12/04/23 [phone]

## 2023-12-04 NOTE — ED Triage Notes (Signed)
 Pt c/o R side chest pain that is worse with deep inspirations. Pt reports this pain has been ongoing since October 31. She has had negative cardiac workup. Pt says the pain normally subsides after taking 800mg  ibuprofen but it did not this morning.
